# Patient Record
Sex: Female | Born: 1995
Health system: Southern US, Community
[De-identification: ages and names within clinical notes are randomized; demographics above are authoritative.]

## PROBLEM LIST (undated history)

## (undated) DIAGNOSIS — N921 Excessive and frequent menstruation with irregular cycle: Secondary | ICD-10-CM

## (undated) HISTORY — DX: Excessive and frequent menstruation with irregular cycle: N92.1

---
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
## 2017-03-18 ENCOUNTER — Other Ambulatory Visit: Payer: Self-pay | Admitting: Obstetrics and Gynecology

## 2017-03-18 DIAGNOSIS — Z3041 Encounter for surveillance of contraceptive pills: Secondary | ICD-10-CM

## 2017-04-15 ENCOUNTER — Telehealth: Payer: Self-pay | Admitting: Obstetrics and Gynecology

## 2017-04-15 NOTE — Telephone Encounter (Signed)
Spoke to pt and advised that 1 year of refills sent to [facility] [address] in April and most likely rx is on file. Pt to call [facility] and have rx transferred. Will call if not there.

## 2017-04-15 NOTE — Telephone Encounter (Signed)
Patient needs refill on bc, annual scheduled 10/17.  Preferred pharmacy [facility] on [address] in [place].

## 2017-05-15 ENCOUNTER — Ambulatory Visit: Payer: BLUE CROSS/BLUE SHIELD | Admitting: Obstetrics and Gynecology

## 2017-10-16 DIAGNOSIS — R300 Dysuria: Secondary | ICD-10-CM | POA: Diagnosis not present

## 2017-11-27 HISTORY — PX: WISDOM TOOTH EXTRACTION: SHX21

## 2017-12-17 ENCOUNTER — Other Ambulatory Visit: Payer: Self-pay | Admitting: Obstetrics and Gynecology

## 2017-12-17 DIAGNOSIS — Z3041 Encounter for surveillance of contraceptive pills: Secondary | ICD-10-CM

## 2017-12-20 ENCOUNTER — Other Ambulatory Visit: Payer: Self-pay | Admitting: Obstetrics and Gynecology

## 2017-12-20 DIAGNOSIS — Z3041 Encounter for surveillance of contraceptive pills: Secondary | ICD-10-CM

## 2017-12-20 NOTE — Telephone Encounter (Signed)
Pt needed 1 refill to get to annual appt with [facility]. Sent in one refill to pharm for pt. Annual 6/11

## 2017-12-27 ENCOUNTER — Telehealth: Payer: Self-pay | Admitting: Obstetrics and Gynecology

## 2017-12-27 NOTE — Telephone Encounter (Signed)
Patient is schedule for annual with [facility] and is requesting refill on birthcontrol please advise

## 2017-12-30 NOTE — Telephone Encounter (Signed)
Called pt to see which pharmacy to send prescription to because there is a receipt from the [facility] in [place] on 12/20/17. Left vm for pt to call back and give [place] some more information.

## 2018-01-07 ENCOUNTER — Encounter: Payer: Self-pay | Admitting: Obstetrics and Gynecology

## 2018-01-07 ENCOUNTER — Ambulatory Visit (INDEPENDENT_AMBULATORY_CARE_PROVIDER_SITE_OTHER): Payer: BLUE CROSS/BLUE SHIELD | Admitting: Obstetrics and Gynecology

## 2018-01-07 VITALS — BP 110/60 | Ht 71.0 in | Wt 133.0 lb

## 2018-01-07 DIAGNOSIS — Z113 Encounter for screening for infections with a predominantly sexual mode of transmission: Secondary | ICD-10-CM | POA: Diagnosis not present

## 2018-01-07 DIAGNOSIS — Z30016 Encounter for initial prescription of transdermal patch hormonal contraceptive device: Secondary | ICD-10-CM

## 2018-01-07 DIAGNOSIS — Z3041 Encounter for surveillance of contraceptive pills: Secondary | ICD-10-CM | POA: Diagnosis not present

## 2018-01-07 DIAGNOSIS — Z01419 Encounter for gynecological examination (general) (routine) without abnormal findings: Secondary | ICD-10-CM | POA: Diagnosis not present

## 2018-01-07 MED ORDER — NORELGESTROMIN-ETH ESTRADIOL 150-35 MCG/24HR TD PTWK: 1.0000 | MEDICATED_PATCH | TRANSDERMAL | 3 refills | Status: DC

## 2018-01-07 NOTE — Progress Notes (Signed)
Chief Complaint  Patient presents with  . Gynecologic Exam     HPI:      [name] is a 22 y.o. G0P0000 who LMP was Patient's last menstrual period was 12/24/2017., presents today for her annual examination. Her menses are regular every 28-30 days, lasting 5 days. Dysmenorrhea mild, occurring first 1-2 days of flow. She continues to have BTB with OCPs, without late/missed pills. Has missed pills but doesn't relate BTB afterwards. Has trouble taking pills daily. Would like non-daily BC. Stopped pills about 2 wks ago when Rx ran out.   Sex activity: single partner, contraception - condoms.  Hx of STDs: none  There is no FH of breast cancer. There is no FH of ovarian cancer. The patient does do self-breast exams.  Tobacco use: The patient denies current or previous tobacco use. Alcohol use: none Exercise: very active  She does get adequate calcium and Vitamin D in her diet.   Past Medical History:  Diagnosis Date  . Menometrorrhagia     History reviewed. No pertinent surgical history.  Family History  Problem Relation Age of Onset  . Diabetes Paternal Grandfather   . Diabetes Maternal Grandmother     Social History   Socioeconomic History  . Marital status: Single    Spouse name: Not on file  . Number of children: Not on file  . Years of education: Not on file  . Highest education level: Not on file  Occupational History  . Not on file  Social Needs  . Financial resource strain: Not on file  . Food insecurity:    Worry: Not on file    Inability: Not on file  . Transportation needs:    Medical: Not on file    Non-medical: Not on file  Tobacco Use  . Smoking status: Never Smoker  . Smokeless tobacco: Never Used  Substance and Sexual Activity  . Alcohol use: No  . Drug use: No  . Sexual activity: Yes  Lifestyle  . Physical activity:    Days per week: Not on file    Minutes per session: Not on file  . Stress: Not on file  Relationships  .  Social connections:    Talks on phone: Not on file    Gets together: Not on file    Attends religious service: Not on file    Active member of club or organization: Not on file    Attends meetings of clubs or organizations: Not on file    Relationship status: Not on file  . Intimate partner violence:    Fear of current or ex partner: Not on file    Emotionally abused: Not on file    Physically abused: Not on file    Forced sexual activity: Not on file  Other Topics Concern  . Not on file  Social History Narrative  . Not on file    Current Outpatient Medications on File Prior to Visit  Medication Sig Dispense Refill  . amoxicillin (AMOXIL) 875 MG tablet Take 875 mg by mouth 2 (two) times daily.  0   No current facility-administered medications on file prior to visit.     ROS:  Review of Systems  Constitutional: Negative for fatigue, fever and unexpected weight change.  Respiratory: Negative for cough, shortness of breath and wheezing.   Cardiovascular: Negative for chest pain, palpitations and leg swelling.  Gastrointestinal: Negative for blood in stool, constipation, diarrhea, nausea and vomiting.  Endocrine: Negative for cold intolerance,  heat intolerance and polyuria.  Genitourinary: Negative for dyspareunia, dysuria, flank pain, frequency, genital sores, hematuria, menstrual problem, pelvic pain, urgency, vaginal bleeding, vaginal discharge and vaginal pain.  Musculoskeletal: Negative for back pain, joint swelling and myalgias.  Skin: Negative for rash.  Neurological: Positive for dizziness and headaches. Negative for syncope, light-headedness and numbness.  Hematological: Negative for adenopathy.  Psychiatric/Behavioral: Negative for agitation, confusion, sleep disturbance and suicidal ideas. The patient is not nervous/anxious.      Objective: BP 110/60   Ht 5\' 11"  (1.803 m)   Wt 133 lb (60.3 kg)   LMP 12/24/2017   BMI 18.55 kg/m    Physical Exam    Constitutional: She is oriented to person, place, and time. She appears well-developed and well-nourished.  Genitourinary: Vagina normal and uterus normal. There is no rash or tenderness on the right labia. There is no rash or tenderness on the left labia. No erythema or tenderness in the vagina. No vaginal discharge found. Right adnexum does not display mass and does not display tenderness. Left adnexum does not display mass and does not display tenderness. Cervix does not exhibit motion tenderness or polyp. Uterus is not enlarged or tender.  Neck: Normal range of motion. No thyromegaly present.  Cardiovascular: Normal rate, regular rhythm and normal heart sounds.  No murmur heard. Pulmonary/Chest: Effort normal and breath sounds normal. Right breast exhibits no mass, no nipple discharge, no skin change and no tenderness. Left breast exhibits no mass, no nipple discharge, no skin change and no tenderness.  Abdominal: Soft. There is no tenderness. There is no guarding.  Musculoskeletal: Normal range of motion.  Neurological: She is alert and oriented to person, place, and time. No cranial nerve deficit.  Psychiatric: She has a normal mood and affect. Her behavior is normal.  Vitals reviewed.   Assessment/Plan: Encounter for annual routine gynecological examination  Screening for STD (sexually transmitted disease) - Plan: Chlamydia/Gonococcus/Trichomonas, NAA  Encounter for initial prescription of transdermal patch hormonal contraceptive device - BC options discussed. Pt wants to try xulane. Rx eRxd. Handout given. Start with next menses/condoms. F/u prn.  - Plan: norelgestromin-ethinyl estradiol Marilu Favre) 150-35 MCG/24HR transdermal patch  Meds ordered this encounter  Medications  . norelgestromin-ethinyl estradiol Marilu Favre) 150-35 MCG/24HR transdermal patch    Sig: Place 1 patch onto the skin once a week. Apply 1 patch weekly for 3 weeks, then 1 week without patch    Dispense:  9 patch     Refill:  3    Order Specific Question:   Supervising Provider    Answer:   Gae Dry [[id number]]             GYN counsel STD prevention, use and side effects of OCP's, adequate intake of calcium and vitamin D, diet and exercise     F/U  Return in about 1 year (around 01/08/2019).  Alicia B. Copland, PA-C 01/07/2018 2:45 PM

## 2018-01-07 NOTE — Patient Instructions (Signed)
I value your feedback and entrusting us with your care. If you get a [facility] patient survey, I would appreciate you taking the time to let us know about your experience today. Thank you! 

## 2018-01-09 LAB — CHLAMYDIA/GONOCOCCUS/TRICHOMONAS, NAA
CHLAMYDIA BY NAA: NEGATIVE
Gonococcus by NAA: NEGATIVE
TRICH VAG BY NAA: NEGATIVE

## 2018-02-20 DIAGNOSIS — Z113 Encounter for screening for infections with a predominantly sexual mode of transmission: Secondary | ICD-10-CM | POA: Diagnosis not present

## 2018-02-24 ENCOUNTER — Telehealth: Payer: Self-pay | Admitting: Obstetrics and Gynecology

## 2018-02-24 NOTE — Telephone Encounter (Signed)
Patient has a recurring UTI and would like a refill on the previous medicine that was prescribed for that.

## 2018-02-24 NOTE — Telephone Encounter (Signed)
Please advise. Thank you

## 2018-02-24 NOTE — Telephone Encounter (Signed)
We haven't treated pt for UTI. She can f/u with whomever treated her last.

## 2018-02-25 NOTE — Telephone Encounter (Signed)
Called pt to talk with her about original doctor that gave her rx and I have left a vm for her to call back

## 2018-06-09 ENCOUNTER — Telehealth: Payer: Self-pay

## 2018-06-09 NOTE — Telephone Encounter (Signed)
Pt called triage saying she wants to switch BC method, from patch to pills. Please advise.

## 2018-06-09 NOTE — Telephone Encounter (Signed)
She had BTB with previous OCPs. Does she want to try different ones to see if helps BTB or restart old ones?

## 2018-06-09 NOTE — Telephone Encounter (Signed)
Called pt, no answer, LVMTRC. 

## 2018-06-17 NOTE — Telephone Encounter (Signed)
Pt called back and said she wants to use a generic brand of what she has used before.

## 2018-06-18 ENCOUNTER — Other Ambulatory Visit: Payer: Self-pay | Admitting: Obstetrics and Gynecology

## 2018-06-18 DIAGNOSIS — Z3041 Encounter for surveillance of contraceptive pills: Secondary | ICD-10-CM

## 2018-06-18 MED ORDER — NORGESTIMATE-ETH ESTRADIOL 0.25-35 MG-MCG PO TABS: 1.0000 | ORAL_TABLET | Freq: Every day | ORAL | 1 refills | Status: DC

## 2018-06-18 NOTE — Telephone Encounter (Signed)
Rx OCPs eRxd. Start when time to start new pack of xulane. Condoms for 1 wk. F/u prn.

## 2018-06-18 NOTE — Telephone Encounter (Signed)
Pt aware. Also updated her preferred pharmacies.

## 2018-06-18 NOTE — Progress Notes (Signed)
Rx change back to OCPs from xulane. Annual due 6/20

## 2018-09-01 DIAGNOSIS — J029 Acute pharyngitis, unspecified: Secondary | ICD-10-CM | POA: Diagnosis not present

## 2018-09-01 DIAGNOSIS — Z113 Encounter for screening for infections with a predominantly sexual mode of transmission: Secondary | ICD-10-CM | POA: Diagnosis not present

## 2018-09-13 DIAGNOSIS — A7489 Other chlamydial diseases: Secondary | ICD-10-CM | POA: Diagnosis not present

## 2018-09-13 DIAGNOSIS — A5400 Gonococcal infection of lower genitourinary tract, unspecified: Secondary | ICD-10-CM | POA: Diagnosis not present

## 2018-12-24 ENCOUNTER — Telehealth: Payer: Self-pay

## 2018-12-24 NOTE — Telephone Encounter (Signed)
Pt called; has appt 6/22nd; wants to get IUD put in; not living in [place] now so it will be hard for her to make the trip here for IUD consultation.  What is best option for her?  [phone]  Called pt to adv [facility] not in office until next week.  Pt plans to keep appt on 6/22nd.  Adv not sure if consultation can be done over the phone or not - that's up to the provider.  Pt not sure which IUD she wants.  Pt can be reached at this same number next week.

## 2018-12-29 MED ORDER — MISOPROSTOL 100 MCG PO TABS: 100.0000 ug | ORAL_TABLET | Freq: Once | ORAL | 0 refills | Status: DC

## 2018-12-29 NOTE — Telephone Encounter (Signed)
Patient returning missed call from [facility]. Please advise

## 2018-12-29 NOTE — Addendum Note (Signed)
Addended by: Ardeth Perfect B on: 11/01/9199 01:39 PM   Modules accepted: Orders

## 2018-12-29 NOTE — Telephone Encounter (Signed)
Discussed IUDs with pt. She is interested in [place]. Has done OCPs and xulane in past. [place] annual sched 6/22 and will most likely be having period then. Rx cytotec/NSAIDs 1 hr before. Will call to resched annual/IUD insertion with period if not bleeding for already sched appt. Pt aware she can be worked into sched for 1 appt since coming from [place].

## 2018-12-29 NOTE — Telephone Encounter (Signed)
LMTRC

## 2019-01-19 ENCOUNTER — Encounter: Payer: BLUE CROSS/BLUE SHIELD | Admitting: Obstetrics and Gynecology

## 2019-01-19 NOTE — Patient Instructions (Signed)
I value your feedback and entrusting us with your care. If you get a [facility] patient survey, I would appreciate you taking the time to let us know about your experience today. Thank you! 

## 2019-01-19 NOTE — Progress Notes (Deleted)
No chief complaint on file.    HPI:      [name] is a 23 y.o. G0P0000 who LMP was No LMP recorded., presents today for her annual examination. Her menses are regular every 28-30 days, lasting 5 days. Dysmenorrhea mild, occurring first 1-2 days of flow. She continues to have BTB with OCPs, without late/missed pills. Has missed pills but doesn't relate BTB afterwards. Has trouble taking pills daily. Would like non-daily BC. Stopped pills about 2 wks ago when Rx ran out.   Sex activity: single partner, contraception - condoms.  Hx of STDs: none  There is no FH of breast cancer. There is no FH of ovarian cancer. The patient does do self-breast exams.  Tobacco use: The patient denies current or previous tobacco use. Alcohol use: none Exercise: very active  She does get adequate calcium and Vitamin D in her diet.   Past Medical History:  Diagnosis Date  . Menometrorrhagia     No past surgical history on file.  Family History  Problem Relation Age of Onset  . Diabetes Paternal Grandfather   . Diabetes Maternal Grandmother     Social History   Socioeconomic History  . Marital status: Single    Spouse name: Not on file  . Number of children: Not on file  . Years of education: Not on file  . Highest education level: Not on file  Occupational History  . Not on file  Social Needs  . Financial resource strain: Not on file  . Food insecurity    Worry: Not on file    Inability: Not on file  . Transportation needs    Medical: Not on file    Non-medical: Not on file  Tobacco Use  . Smoking status: Never Smoker  . Smokeless tobacco: Never Used  Substance and Sexual Activity  . Alcohol use: No  . Drug use: No  . Sexual activity: Yes  Lifestyle  . Physical activity    Days per week: Not on file    Minutes per session: Not on file  . Stress: Not on file  Relationships  . Social Herbalist on phone: Not on file    Gets together: Not on file     Attends religious service: Not on file    Active member of club or organization: Not on file    Attends meetings of clubs or organizations: Not on file    Relationship status: Not on file  . Intimate partner violence    Fear of current or ex partner: Not on file    Emotionally abused: Not on file    Physically abused: Not on file    Forced sexual activity: Not on file  Other Topics Concern  . Not on file  Social History Narrative  . Not on file    Current Outpatient Medications on File Prior to Visit  Medication Sig Dispense Refill  . amoxicillin (AMOXIL) 875 MG tablet Take 875 mg by mouth 2 (two) times daily.  0  . misoprostol (CYTOTEC) 100 MCG tablet Take 1 tablet (100 mcg total) by mouth once for 1 dose. 1 hour before appt 1 tablet 0  . norgestimate-ethinyl estradiol (ESTARYLLA) 0.25-35 MG-MCG tablet Take 1 tablet by mouth daily. 84 tablet 1   No current facility-administered medications on file prior to visit.     ROS:  Review of Systems  Constitutional: Negative for fatigue, fever and unexpected weight change.  Respiratory: Negative for cough,  shortness of breath and wheezing.   Cardiovascular: Negative for chest pain, palpitations and leg swelling.  Gastrointestinal: Negative for blood in stool, constipation, diarrhea, nausea and vomiting.  Endocrine: Negative for cold intolerance, heat intolerance and polyuria.  Genitourinary: Negative for dyspareunia, dysuria, flank pain, frequency, genital sores, hematuria, menstrual problem, pelvic pain, urgency, vaginal bleeding, vaginal discharge and vaginal pain.  Musculoskeletal: Negative for back pain, joint swelling and myalgias.  Skin: Negative for rash.  Neurological: Positive for dizziness and headaches. Negative for syncope, light-headedness and numbness.  Hematological: Negative for adenopathy.  Psychiatric/Behavioral: Negative for agitation, confusion, sleep disturbance and suicidal ideas. The patient is not  nervous/anxious.      Objective: There were no vitals taken for this visit.   Physical Exam Constitutional:      Appearance: She is well-developed.  Genitourinary:     Vagina and uterus normal.     No vaginal discharge, erythema or tenderness.     No cervical motion tenderness or polyp.     Uterus is not enlarged or tender.     No right or left adnexal mass present.     Right adnexa not tender.     Left adnexa not tender.  Neck:     Musculoskeletal: Normal range of motion.     Thyroid: No thyromegaly.  Cardiovascular:     Rate and Rhythm: Normal rate and regular rhythm.     Heart sounds: Normal heart sounds. No murmur.  Pulmonary:     Effort: Pulmonary effort is normal.     Breath sounds: Normal breath sounds.  Chest:     Breasts:        Right: No mass, nipple discharge, skin change or tenderness.        Left: No mass, nipple discharge, skin change or tenderness.  Abdominal:     Palpations: Abdomen is soft.     Tenderness: There is no abdominal tenderness. There is no guarding.  Musculoskeletal: Normal range of motion.  Neurological:     Mental Status: She is alert and oriented to person, place, and time.     Cranial Nerves: No cranial nerve deficit.  Psychiatric:        Behavior: Behavior normal.  Vitals signs reviewed.     Assessment/Plan: No diagnosis found.  No orders of the defined types were placed in this encounter.            GYN counsel STD prevention, use and side effects of OCP's, adequate intake of calcium and vitamin D, diet and exercise     F/U  No follow-ups on file.  Dmauri Rosenow B. Marvell Tamer, PA-C 01/19/2019 1:28 PM

## 2019-01-22 NOTE — Progress Notes (Signed)
This encounter was created in error - please disregard.

## 2019-03-21 DIAGNOSIS — R300 Dysuria: Secondary | ICD-10-CM | POA: Diagnosis not present

## 2019-04-27 DIAGNOSIS — B850 Pediculosis due to Pediculus humanus capitis: Secondary | ICD-10-CM | POA: Diagnosis not present

## 2019-06-03 NOTE — Progress Notes (Addendum)
Chief Complaint  Patient presents with  . Gynecologic Exam    flu shot     HPI:      [name] is a 23 y.o. G0P0000 who LMP was Patient's last menstrual period was 05/22/2019 (approximate)., presents today for her annual examination. Her menses are regular every 28-30 days, lasting 5 days. Dysmenorrhea mild, occurring first 1-2 days of flow. No BTB. Tried xulane last yr but had mood changes. Went back to OCPs and doing well. Rx ran out. Would like to restart for menometrorrhagia. Hx of anemia in past.  Sex activity: not currently - OCPs  Hx of STDs: none  There is no FH of breast cancer. There is no FH of ovarian cancer. The patient does do self-breast exams.  Tobacco use: The patient denies current or previous tobacco use. Alcohol use: social No drug use.  Exercise: mod active  She does get adequate calcium but not Vitamin D in her diet.  Has noted a wt loss recently with decreased appetite. Hx of anemia, taking iron supp, sometimes on empty stomach. Has lost 12# since we saw her last yr.  Pt with cervical LAN bilat, R>L. States they never went down after flu last yr. Not tender.   Past Medical History:  Diagnosis Date  . Menometrorrhagia     History reviewed. No pertinent surgical history.  Family History  Problem Relation Age of Onset  . Diabetes Paternal Grandfather   . Parkinson's disease Paternal Grandfather   . Diabetes Maternal Grandmother   . Heart attack Maternal Grandmother     Social History   Socioeconomic History  . Marital status: Single    Spouse name: Not on file  . Number of children: Not on file  . Years of education: Not on file  . Highest education level: Not on file  Occupational History  . Not on file  Social Needs  . Financial resource strain: Not on file  . Food insecurity    Worry: Not on file    Inability: Not on file  . Transportation needs    Medical: Not on file    Non-medical: Not on file  Tobacco Use  .  Smoking status: Never Smoker  . Smokeless tobacco: Never Used  Substance and Sexual Activity  . Alcohol use: No  . Drug use: No  . Sexual activity: Yes    Birth control/protection: Condom  Lifestyle  . Physical activity    Days per week: Not on file    Minutes per session: Not on file  . Stress: Not on file  Relationships  . Social Herbalist on phone: Not on file    Gets together: Not on file    Attends religious service: Not on file    Active member of club or organization: Not on file    Attends meetings of clubs or organizations: Not on file    Relationship status: Not on file  . Intimate partner violence    Fear of current or ex partner: Not on file    Emotionally abused: Not on file    Physically abused: Not on file    Forced sexual activity: Not on file  Other Topics Concern  . Not on file  Social History Narrative  . Not on file    No current outpatient medications on file prior to visit.   No current facility-administered medications on file prior to visit.     ROS:  Review of Systems  Constitutional: Positive for unexpected weight change. Negative for fatigue and fever.  Respiratory: Negative for cough, shortness of breath and wheezing.   Cardiovascular: Negative for chest pain, palpitations and leg swelling.  Gastrointestinal: Negative for blood in stool, constipation, diarrhea, nausea and vomiting.  Endocrine: Negative for cold intolerance, heat intolerance and polyuria.  Genitourinary: Positive for frequency. Negative for dyspareunia, dysuria, flank pain, genital sores, hematuria, menstrual problem, pelvic pain, urgency, vaginal bleeding, vaginal discharge and vaginal pain.  Musculoskeletal: Negative for back pain, joint swelling and myalgias.  Skin: Negative for rash.  Neurological: Negative for dizziness, syncope, light-headedness, numbness and headaches.  Hematological: Negative for adenopathy.  Psychiatric/Behavioral: Negative for agitation,  confusion, sleep disturbance and suicidal ideas. The patient is not nervous/anxious.      Objective: BP 90/66   Ht 5\' 11"  (1.803 m)   Wt 121 lb (54.9 kg)   LMP 05/22/2019 (Approximate)   BMI 16.88 kg/m    Physical Exam Constitutional:      Appearance: She is well-developed.  Genitourinary:     Vulva, vagina, uterus, right adnexa and left adnexa normal.     No vulval lesion or tenderness noted.     No vaginal discharge, erythema or tenderness.     No cervical motion tenderness or polyp.     Uterus is not enlarged or tender.     No right or left adnexal mass present.     Right adnexa not tender.     Left adnexa not tender.  Neck:     Musculoskeletal: Normal range of motion.     Thyroid: No thyromegaly.   Cardiovascular:     Rate and Rhythm: Normal rate and regular rhythm.     Heart sounds: Normal heart sounds. No murmur.  Pulmonary:     Effort: Pulmonary effort is normal.     Breath sounds: Normal breath sounds.  Chest:     Breasts:        Right: No mass, nipple discharge, skin change or tenderness.        Left: No mass, nipple discharge, skin change or tenderness.  Abdominal:     Palpations: Abdomen is soft.     Tenderness: There is no abdominal tenderness. There is no guarding.  Musculoskeletal: Normal range of motion.  Lymphadenopathy:     Cervical: Cervical adenopathy present.     Right cervical: Superficial cervical adenopathy present.     Left cervical: Superficial cervical adenopathy present.  Neurological:     General: No focal deficit present.     Mental Status: She is alert and oriented to person, place, and time.     Cranial Nerves: No cranial nerve deficit.  Skin:    General: Skin is warm and dry.  Psychiatric:        Mood and Affect: Mood normal.        Behavior: Behavior normal.        Thought Content: Thought content normal.        Judgment: Judgment normal.  Vitals signs reviewed.     Assessment/Plan: Encounter for annual routine  gynecological examination  Screening for STD--gon/chlam.   Encounter for surveillance of contraceptive pills - Plan: norgestimate-ethinyl estradiol (ESTARYLLA) 0.25-35 MG-MCG tablet; OCP RF. Start with next menses, condoms.   Lymphadenopathy of head and neck - Plan: Comprehensive metabolic panel, CBC with Differential/Platelet; Check labs. If neg, will refer to gen surg for further eval.  Weight loss - Plan: TSH + free T4; check labs. Will f/u with results.  Blood tests for routine general physical examination - Plan: Comprehensive metabolic panel, CBC with Differential/Platelet  Needs flu shot - Plan: Flu Vaccine QUAD 36+ mos IM (Fluarix, Quad PF)  Meds ordered this encounter  Medications  . norgestimate-ethinyl estradiol (ESTARYLLA) 0.25-35 MG-MCG tablet    Sig: Take 1 tablet by mouth daily.    Dispense:  84 tablet    Refill:  3    Order Specific Question:   Supervising Provider    Answer:   Gae Dry [id number]             GYN counsel STD prevention, adequate intake of calcium and vitamin D, diet and exercise     F/U  Return in about 1 year (around 06/03/2020).  Raven Vokes B. Lyah Millirons, PA-C 06/04/2019 10:25 AM

## 2019-06-04 ENCOUNTER — Other Ambulatory Visit (HOSPITAL_COMMUNITY)
Admission: RE | Admit: 2019-06-04 | Discharge: 2019-06-04 | Disposition: A | Discharge status: Home / Self Care | Payer: BC Managed Care – PPO | Source: Ambulatory Visit | Attending: Obstetrics and Gynecology | Admitting: Obstetrics and Gynecology

## 2019-06-04 ENCOUNTER — Ambulatory Visit (INDEPENDENT_AMBULATORY_CARE_PROVIDER_SITE_OTHER): Payer: BC Managed Care – PPO | Admitting: Obstetrics and Gynecology

## 2019-06-04 ENCOUNTER — Other Ambulatory Visit: Payer: Self-pay

## 2019-06-04 ENCOUNTER — Encounter: Payer: Self-pay | Admitting: Obstetrics and Gynecology

## 2019-06-04 VITALS — BP 90/66 | Ht 71.0 in | Wt 121.0 lb

## 2019-06-04 DIAGNOSIS — Z01419 Encounter for gynecological examination (general) (routine) without abnormal findings: Secondary | ICD-10-CM | POA: Diagnosis not present

## 2019-06-04 DIAGNOSIS — Z23 Encounter for immunization: Secondary | ICD-10-CM | POA: Diagnosis not present

## 2019-06-04 DIAGNOSIS — R634 Abnormal weight loss: Secondary | ICD-10-CM

## 2019-06-04 DIAGNOSIS — Z113 Encounter for screening for infections with a predominantly sexual mode of transmission: Secondary | ICD-10-CM | POA: Diagnosis not present

## 2019-06-04 DIAGNOSIS — Z0000 Encounter for general adult medical examination without abnormal findings: Secondary | ICD-10-CM

## 2019-06-04 DIAGNOSIS — R591 Generalized enlarged lymph nodes: Secondary | ICD-10-CM

## 2019-06-04 DIAGNOSIS — Z3041 Encounter for surveillance of contraceptive pills: Secondary | ICD-10-CM

## 2019-06-04 MED ORDER — NORGESTIMATE-ETH ESTRADIOL 0.25-35 MG-MCG PO TABS: 1.0000 | ORAL_TABLET | Freq: Every day | ORAL | 3 refills | Status: DC

## 2019-06-04 NOTE — Patient Instructions (Addendum)
I value your feedback and entrusting us with your care. If you get a [facility] patient survey, I would appreciate you taking the time to let us know about your experience today. Thank you! 

## 2019-06-04 NOTE — Addendum Note (Signed)
Addended by: Ardeth Perfect B on: [id number] 10:34 AM   Modules accepted: Orders

## 2019-06-05 LAB — COMPREHENSIVE METABOLIC PANEL
ALT: 10 IU/L (ref 0–32)
AST: 16 IU/L (ref 0–40)
Albumin/Globulin Ratio: 1.8 (ref 1.2–2.2)
Albumin: 4.5 g/dL (ref 3.9–5.0)
Alkaline Phosphatase: 71 IU/L (ref 39–117)
BUN/Creatinine Ratio: 11 (ref 9–23)
BUN: 12 mg/dL (ref 6–20)
Bilirubin Total: 0.8 mg/dL (ref 0.0–1.2)
CO2: 22 mmol/L (ref 20–29)
Calcium: 9.9 mg/dL (ref 8.7–10.2)
Chloride: 98 mmol/L (ref 96–106)
Creatinine, Ser: 1.09 mg/dL — ABNORMAL HIGH (ref 0.57–1.00)
GFR calc Af Amer: 84 mL/min/{1.73_m2} (ref >59)
GFR calc non Af Amer: 73 mL/min/{1.73_m2} (ref >59)
Globulin, Total: 2.5 g/dL (ref 1.5–4.5)
Glucose: 92 mg/dL (ref 65–99)
Potassium: 4.2 mmol/L (ref 3.5–5.2)
Sodium: 139 mmol/L (ref 134–144)
Total Protein: 7 g/dL (ref 6.0–8.5)

## 2019-06-05 LAB — CBC WITH DIFFERENTIAL/PLATELET
Basophils Absolute: 0.1 10*3/uL (ref 0.0–0.2)
Basos: 1 %
EOS (ABSOLUTE): 0.4 10*3/uL (ref 0.0–0.4)
Eos: 4 %
Hematocrit: 39.4 % (ref 34.0–46.6)
Hemoglobin: 13.2 g/dL (ref 11.1–15.9)
Immature Grans (Abs): 0 10*3/uL (ref 0.0–0.1)
Immature Granulocytes: 0 %
Lymphocytes Absolute: 1.5 10*3/uL (ref 0.7–3.1)
Lymphs: 15 %
MCH: 30.4 pg (ref 26.6–33.0)
MCHC: 33.5 g/dL (ref 31.5–35.7)
MCV: 91 fL (ref 79–97)
Monocytes Absolute: 0.7 10*3/uL (ref 0.1–0.9)
Monocytes: 8 %
Neutrophils Absolute: 7 10*3/uL (ref 1.4–7.0)
Neutrophils: 72 %
Platelets: 274 10*3/uL (ref 150–450)
RBC: 4.34 x10E6/uL (ref 3.77–5.28)
RDW: 12.1 % (ref 11.7–15.4)
WBC: 9.6 10*3/uL (ref 3.4–10.8)

## 2019-06-05 LAB — TSH+FREE T4
Free T4: 1.28 ng/dL (ref 0.82–1.77)
TSH: 0.88 u[IU]/mL (ref 0.450–4.500)

## 2019-06-05 LAB — CERVICOVAGINAL ANCILLARY ONLY
Chlamydia: NEGATIVE
Comment: NEGATIVE
Comment: NORMAL
Neisseria Gonorrhea: NEGATIVE

## 2019-06-08 ENCOUNTER — Telehealth: Payer: Self-pay | Admitting: Obstetrics and Gynecology

## 2019-06-08 DIAGNOSIS — R591 Generalized enlarged lymph nodes: Secondary | ICD-10-CM

## 2019-06-08 NOTE — Telephone Encounter (Signed)
[facility]. Neg labs, will refer to ENT due to [facility] for past yr with wt loss.

## 2019-06-08 NOTE — Telephone Encounter (Signed)
Pt aware of labs. Refer to ENT for LAN.

## 2019-06-18 ENCOUNTER — Telehealth: Payer: Self-pay

## 2019-06-18 ENCOUNTER — Other Ambulatory Visit: Payer: Self-pay | Admitting: Unknown Physician Specialty

## 2019-06-18 DIAGNOSIS — R599 Enlarged lymph nodes, unspecified: Secondary | ICD-10-CM | POA: Diagnosis not present

## 2019-06-18 DIAGNOSIS — R221 Localized swelling, mass and lump, neck: Secondary | ICD-10-CM

## 2019-06-18 NOTE — Telephone Encounter (Signed)
Pt calling.  Has questions about blood work.  [phone]

## 2019-06-18 NOTE — Telephone Encounter (Signed)
Pt wanted to know if labwork done recently would show if she was pregnant. Advised not in these.

## 2019-06-29 ENCOUNTER — Ambulatory Visit
Admission: RE | Admit: 2019-06-29 | Discharge: 2019-06-29 | Disposition: A | Discharge status: Home / Self Care | Payer: BC Managed Care – PPO | Source: Ambulatory Visit | Attending: Unknown Physician Specialty | Admitting: Unknown Physician Specialty

## 2019-06-29 ENCOUNTER — Other Ambulatory Visit: Payer: Self-pay

## 2019-06-29 DIAGNOSIS — R221 Localized swelling, mass and lump, neck: Secondary | ICD-10-CM | POA: Diagnosis not present

## 2019-07-03 ENCOUNTER — Other Ambulatory Visit: Payer: Self-pay | Admitting: Unknown Physician Specialty

## 2019-07-03 DIAGNOSIS — R221 Localized swelling, mass and lump, neck: Secondary | ICD-10-CM

## 2019-07-07 ENCOUNTER — Other Ambulatory Visit: Payer: Self-pay | Admitting: Student

## 2019-07-08 ENCOUNTER — Ambulatory Visit
Admission: RE | Admit: 2019-07-08 | Discharge: 2019-07-08 | Disposition: A | Discharge status: Home / Self Care | Payer: BC Managed Care – PPO | Source: Ambulatory Visit | Attending: Unknown Physician Specialty | Admitting: Unknown Physician Specialty

## 2019-07-08 ENCOUNTER — Other Ambulatory Visit: Payer: Self-pay

## 2019-07-08 DIAGNOSIS — R590 Localized enlarged lymph nodes: Secondary | ICD-10-CM | POA: Diagnosis not present

## 2019-07-08 DIAGNOSIS — R221 Localized swelling, mass and lump, neck: Secondary | ICD-10-CM | POA: Insufficient documentation

## 2019-07-08 LAB — CBC
HCT: 36.5 % (ref 36.0–46.0)
Hemoglobin: 12.6 g/dL (ref 12.0–15.0)
MCH: 29.9 pg (ref 26.0–34.0)
MCHC: 34.5 g/dL (ref 30.0–36.0)
MCV: 86.5 fL (ref 80.0–100.0)
Platelets: 272 10*3/uL (ref 150–400)
RBC: 4.22 MIL/uL (ref 3.87–5.11)
RDW: 12.7 % (ref 11.5–15.5)
WBC: 5.9 10*3/uL (ref 4.0–10.5)
nRBC: 0 % (ref 0.0–0.2)

## 2019-07-08 LAB — PROTIME-INR
INR: 1 (ref 0.8–1.2)
Prothrombin Time: 13.3 s (ref 11.4–15.2)

## 2019-07-08 LAB — PREGNANCY, URINE: Preg Test, Ur: NEGATIVE

## 2019-07-08 MED ORDER — SODIUM CHLORIDE 0.9 % IV SOLN: INTRAVENOUS | Status: DC

## 2019-07-08 NOTE — Discharge Instructions (Signed)
Needle Biopsy, Care After °These instructions tell you how to care for yourself after your procedure. Your doctor may also give you more specific instructions. Call your doctor if you have any problems or questions. °What can I expect after the procedure? °After the procedure, it is common to have: °· Soreness. °· Bruising. °· Mild pain. °Follow these instructions at home: ° °· Return to your normal activities as told by your doctor. Ask your doctor what activities are safe for you. °· Take over-the-counter and prescription medicines only as told by your doctor. °· Wash your hands with soap and water before you change your bandage (dressing). If you cannot use soap and water, use hand sanitizer. °· Follow instructions from your doctor about: °? How to take care of your puncture site. °? When and how to change your bandage. °? When to remove your bandage. °· Check your puncture site every day for signs of infection. Watch for: °? Redness, swelling, or pain. °? Fluid or blood.  °? Pus or a bad smell. °? Warmth. °· Do not take baths, swim, or use a hot tub until your doctor approves. Ask your doctor if you may take showers. You may only be allowed to take sponge baths. °· Keep all follow-up visits as told by your doctor. This is important. °Contact a doctor if you have: °· A fever. °· Redness, swelling, or pain at the puncture site, and it lasts longer than a few days. °· Fluid, blood, or pus coming from the puncture site. °· Warmth coming from the puncture site. °Get help right away if: °· You have a lot of bleeding from the puncture site. °Summary °· After the procedure, it is common to have soreness, bruising, or mild pain at the puncture site. °· Check your puncture site every day for signs of infection, such as redness, swelling, or pain. °· Get help right away if you have severe bleeding from your puncture site. °This information is not intended to replace advice given to you by your health care provider. Make  sure you discuss any questions you have with your health care provider. °Document Released: 06/28/2008 Document Revised: 07/29/2017 Document Reviewed: 07/29/2017 °Elsevier Patient Education © 2020 [employer] ° °

## 2019-07-08 NOTE — Procedures (Signed)
Interventional Radiology Procedure Note  Procedure: US guided right cervical lymph node biopsy.  Mx 18g bx acquired.  Samples submitted fresh for pathology and for culture   Complications: None  Recommendations:  - Ok to shower tomorrow - Do not submerge for 7 days - Routine care   Signed,  Dulcy Fanny. Earleen Newport, DO

## 2019-07-09 LAB — ACID FAST SMEAR (AFB, MYCOBACTERIA): Acid Fast Smear: NEGATIVE

## 2019-07-09 LAB — SURGICAL PATHOLOGY

## 2019-07-17 ENCOUNTER — Other Ambulatory Visit: Payer: Self-pay | Admitting: Unknown Physician Specialty

## 2019-07-17 DIAGNOSIS — L040 Acute lymphadenitis of face, head and neck: Secondary | ICD-10-CM

## 2019-07-28 ENCOUNTER — Other Ambulatory Visit: Payer: Self-pay | Admitting: Student

## 2019-07-29 ENCOUNTER — Other Ambulatory Visit: Payer: Self-pay | Admitting: Unknown Physician Specialty

## 2019-07-29 ENCOUNTER — Ambulatory Visit
Admission: RE | Admit: 2019-07-29 | Discharge: 2019-07-29 | Disposition: A | Discharge status: Home / Self Care | Payer: BC Managed Care – PPO | Source: Ambulatory Visit | Attending: Unknown Physician Specialty | Admitting: Unknown Physician Specialty

## 2019-07-29 DIAGNOSIS — R590 Localized enlarged lymph nodes: Secondary | ICD-10-CM | POA: Diagnosis not present

## 2019-07-29 DIAGNOSIS — L040 Acute lymphadenitis of face, head and neck: Secondary | ICD-10-CM

## 2019-07-29 MED ORDER — SODIUM CHLORIDE 0.9 % IV SOLN: INTRAVENOUS | Status: DC

## 2019-07-29 NOTE — Discharge Instructions (Signed)
Needle Biopsy, Care After °These instructions tell you how to care for yourself after your procedure. Your doctor may also give you more specific instructions. Call your doctor if you have any problems or questions. °What can I expect after the procedure? °After the procedure, it is common to have: °· Soreness. °· Bruising. °· Mild pain. °Follow these instructions at home: ° °· Return to your normal activities as told by your doctor. Ask your doctor what activities are safe for you. °· Take over-the-counter and prescription medicines only as told by your doctor. °· Wash your hands with soap and water before you change your bandage (dressing). If you cannot use soap and water, use hand sanitizer. °· Follow instructions from your doctor about: °? How to take care of your puncture site. °? When and how to change your bandage. °? When to remove your bandage. °· Check your puncture site every day for signs of infection. Watch for: °? Redness, swelling, or pain. °? Fluid or blood.  °? Pus or a bad smell. °? Warmth. °· Do not take baths, swim, or use a hot tub until your doctor approves. Ask your doctor if you may take showers. You may only be allowed to take sponge baths. °· Keep all follow-up visits as told by your doctor. This is important. °Contact a doctor if you have: °· A fever. °· Redness, swelling, or pain at the puncture site, and it lasts longer than a few days. °· Fluid, blood, or pus coming from the puncture site. °· Warmth coming from the puncture site. °Get help right away if: °· You have a lot of bleeding from the puncture site. °Summary °· After the procedure, it is common to have soreness, bruising, or mild pain at the puncture site. °· Check your puncture site every day for signs of infection, such as redness, swelling, or pain. °· Get help right away if you have severe bleeding from your puncture site. °This information is not intended to replace advice given to you by your health care provider. Make  sure you discuss any questions you have with your health care provider. °Document Released: 06/28/2008 Document Revised: 07/29/2017 Document Reviewed: 07/29/2017 °Elsevier Patient Education © 2020 [employer] ° °

## 2019-07-29 NOTE — Procedures (Signed)
Pre Procedure Dx: Right cervical lymphadenopathy Post Procedural Dx: Same  Technically successful US guided FNA and core biopsy of indeterminate high right cervical lymph node.  EBL: None No immediate complications.   Ronny Bacon, MD Pager #: [phone]

## 2019-08-03 ENCOUNTER — Encounter: Payer: Self-pay | Admitting: Unknown Physician Specialty

## 2019-08-03 LAB — CYTOLOGY - NON PAP

## 2019-08-03 LAB — SURGICAL PATHOLOGY

## 2019-08-21 LAB — ACID FAST CULTURE WITH REFLEXED SENSITIVITIES (MYCOBACTERIA): Acid Fast Culture: NEGATIVE

## 2019-11-13 DIAGNOSIS — D2271 Melanocytic nevi of right lower limb, including hip: Secondary | ICD-10-CM | POA: Diagnosis not present

## 2019-11-13 DIAGNOSIS — D2261 Melanocytic nevi of right upper limb, including shoulder: Secondary | ICD-10-CM | POA: Diagnosis not present

## 2019-11-13 DIAGNOSIS — D485 Neoplasm of uncertain behavior of skin: Secondary | ICD-10-CM | POA: Diagnosis not present

## 2019-11-13 DIAGNOSIS — D2262 Melanocytic nevi of left upper limb, including shoulder: Secondary | ICD-10-CM | POA: Diagnosis not present

## 2019-11-13 DIAGNOSIS — D225 Melanocytic nevi of trunk: Secondary | ICD-10-CM | POA: Diagnosis not present

## 2019-11-13 DIAGNOSIS — R208 Other disturbances of skin sensation: Secondary | ICD-10-CM | POA: Diagnosis not present

## 2020-04-17 DIAGNOSIS — Z20822 Contact with and (suspected) exposure to covid-19: Secondary | ICD-10-CM | POA: Diagnosis not present

## 2020-05-16 DIAGNOSIS — Z03818 Encounter for observation for suspected exposure to other biological agents ruled out: Secondary | ICD-10-CM | POA: Diagnosis not present

## 2020-05-16 DIAGNOSIS — Z20822 Contact with and (suspected) exposure to covid-19: Secondary | ICD-10-CM | POA: Diagnosis not present

## 2020-06-09 DIAGNOSIS — Z111 Encounter for screening for respiratory tuberculosis: Secondary | ICD-10-CM | POA: Diagnosis not present

## 2020-06-09 DIAGNOSIS — Z23 Encounter for immunization: Secondary | ICD-10-CM | POA: Diagnosis not present

## 2020-06-11 DIAGNOSIS — Z111 Encounter for screening for respiratory tuberculosis: Secondary | ICD-10-CM | POA: Diagnosis not present

## 2020-07-06 ENCOUNTER — Ambulatory Visit (INDEPENDENT_AMBULATORY_CARE_PROVIDER_SITE_OTHER): Payer: BC Managed Care – PPO | Admitting: Obstetrics and Gynecology

## 2020-07-06 ENCOUNTER — Other Ambulatory Visit (HOSPITAL_COMMUNITY)
Admission: RE | Admit: 2020-07-06 | Discharge: 2020-07-06 | Disposition: A | Discharge status: Home / Self Care | Payer: BC Managed Care – PPO | Source: Ambulatory Visit | Attending: Obstetrics and Gynecology | Admitting: Obstetrics and Gynecology

## 2020-07-06 ENCOUNTER — Other Ambulatory Visit: Payer: Self-pay

## 2020-07-06 ENCOUNTER — Encounter: Payer: Self-pay | Admitting: Obstetrics and Gynecology

## 2020-07-06 VITALS — BP 90/60 | Ht 71.0 in | Wt 131.0 lb

## 2020-07-06 DIAGNOSIS — Z113 Encounter for screening for infections with a predominantly sexual mode of transmission: Secondary | ICD-10-CM

## 2020-07-06 DIAGNOSIS — Z3041 Encounter for surveillance of contraceptive pills: Secondary | ICD-10-CM

## 2020-07-06 DIAGNOSIS — Z01419 Encounter for gynecological examination (general) (routine) without abnormal findings: Secondary | ICD-10-CM

## 2020-07-06 DIAGNOSIS — Z124 Encounter for screening for malignant neoplasm of cervix: Secondary | ICD-10-CM

## 2020-07-06 MED ORDER — LEVONORGESTREL-ETHINYL ESTRAD 0.1-20 MG-MCG PO TABS: 1.0000 | ORAL_TABLET | Freq: Every day | ORAL | 3 refills | Status: DC

## 2020-07-06 NOTE — Patient Instructions (Signed)
I value your feedback and entrusting us with your care. If you get a [facility] patient survey, I would appreciate you taking the time to let us know about your experience today. Thank you!  As of July 09, 2019, your lab results will be released to your MyChart immediately, before I even have a chance to see them. Please give me time to review them and contact you if there are any abnormalities. Thank you for your patience.  

## 2020-07-06 NOTE — Progress Notes (Signed)
Chief Complaint  Patient presents with  . Gynecologic Exam     HPI:      [name] is a 24 y.o. G0P0000 who LMP was Patient's last menstrual period was 06/26/2020 (exact date)., presents today for her annual examination. Her menses are regular every 28-30 days, lasting 5-6 days. Dysmenorrhea mild, occurring first 1-2 days of flow. No BTB usually but is having some this cycle. Went off OCPs about 6 months ago due to AM n/v (even though had been on them awhile). Stopped pills and sx resolved. Would like to restart pills. Tried xulane last yr but had mood changes. Hx of menorrhagia and anemia in past.  Sex activity: currently sex active, contraception - none now but was doing OCPs. Wants to restart them, will do condoms in meantime. Last pap: never due to age; due today. Hx of STDs: none  There is no FH of breast cancer. There is no FH of ovarian cancer. The patient does do self-breast exams.  Tobacco use: The patient denies current or previous tobacco use. Alcohol use: social No drug use.  Exercise: mod active  She does get adequate calcium and Vitamin D in her diet.   Past Medical History:  Diagnosis Date  . Menometrorrhagia     Past Surgical History:  Procedure Laterality Date  . WISDOM TOOTH EXTRACTION  11/2017    Family History  Problem Relation Age of Onset  . Diabetes Paternal Grandfather   . Parkinson's disease Paternal Grandfather   . Diabetes Maternal Grandmother   . Heart attack Maternal Grandmother     Social History   Socioeconomic History  . Marital status: Single    Spouse name: Not on file  . Number of children: Not on file  . Years of education: Not on file  . Highest education level: Not on file  Occupational History  . Not on file  Tobacco Use  . Smoking status: Never Smoker  . Smokeless tobacco: Never Used  Vaping Use  . Vaping Use: Never used  Substance and Sexual Activity  . Alcohol use: Yes    Comment: occasionally once  a week  . Drug use: No  . Sexual activity: Yes    Birth control/protection: None  Other Topics Concern  . Not on file  Social History Narrative   Lives alone in apartment   Social Determinants of Health   Financial Resource Strain:   . Difficulty of Paying Living Expenses: Not on file  Food Insecurity:   . Worried About Charity fundraiser in the Last Year: Not on file  . Ran Out of Food in the Last Year: Not on file  Transportation Needs:   . Lack of Transportation (Medical): Not on file  . Lack of Transportation (Non-Medical): Not on file  Physical Activity:   . Days of Exercise per Week: Not on file  . Minutes of Exercise per Session: Not on file  Stress:   . Feeling of Stress : Not on file  Social Connections:   . Frequency of Communication with Friends and Family: Not on file  . Frequency of Social Gatherings with Friends and Family: Not on file  . Attends Religious Services: Not on file  . Active Member of Clubs or Organizations: Not on file  . Attends Archivist Meetings: Not on file  . Marital Status: Not on file  Intimate Partner Violence:   . Fear of Current or Ex-Partner: Not on file  . Emotionally Abused:  Not on file  . Physically Abused: Not on file  . Sexually Abused: Not on file    No current outpatient medications on file prior to visit.   No current facility-administered medications on file prior to visit.    ROS:  Review of Systems  Constitutional: Negative for fatigue, fever and unexpected weight change.  Respiratory: Negative for cough, shortness of breath and wheezing.   Cardiovascular: Negative for chest pain, palpitations and leg swelling.  Gastrointestinal: Negative for blood in stool, constipation, diarrhea, nausea and vomiting.  Endocrine: Negative for cold intolerance, heat intolerance and polyuria.  Genitourinary: Positive for vaginal bleeding. Negative for dyspareunia, dysuria, flank pain, frequency, genital sores, hematuria,  menstrual problem, pelvic pain, urgency, vaginal discharge and vaginal pain.  Musculoskeletal: Negative for back pain, joint swelling and myalgias.  Skin: Negative for rash.  Neurological: Negative for dizziness, syncope, light-headedness, numbness and headaches.  Hematological: Negative for adenopathy.  Psychiatric/Behavioral: Negative for agitation, confusion, sleep disturbance and suicidal ideas. The patient is not nervous/anxious.      Objective: BP 90/60   Ht 5\' 11"  (1.803 m)   Wt 131 lb (59.4 kg)   LMP 06/26/2020 (Exact Date)   BMI 18.27 kg/m    Physical Exam Constitutional:      Appearance: She is well-developed.  Genitourinary:     Vulva, cervix, uterus, right adnexa and left adnexa normal.     No vulval lesion or tenderness noted.     Vaginal bleeding present.     No vaginal discharge, erythema or tenderness.     No cervical polyp.     Uterus is not enlarged or tender.     No right or left adnexal mass present.     Right adnexa not tender.     Left adnexa not tender.  Neck:     Thyroid: No thyromegaly.  Cardiovascular:     Rate and Rhythm: Normal rate and regular rhythm.     Heart sounds: Normal heart sounds. No murmur heard.   Pulmonary:     Effort: Pulmonary effort is normal.     Breath sounds: Normal breath sounds.  Chest:     Breasts:        Right: No mass, nipple discharge, skin change or tenderness.        Left: No mass, nipple discharge, skin change or tenderness.  Abdominal:     Palpations: Abdomen is soft.     Tenderness: There is no abdominal tenderness. There is no guarding.  Musculoskeletal:        General: Normal range of motion.     Cervical back: Normal range of motion.  Neurological:     General: No focal deficit present.     Mental Status: She is alert and oriented to person, place, and time.     Cranial Nerves: No cranial nerve deficit.  Skin:    General: Skin is warm and dry.  Psychiatric:        Mood and Affect: Mood normal.         Behavior: Behavior normal.        Thought Content: Thought content normal.        Judgment: Judgment normal.  Vitals reviewed.     Assessment/Plan: Encounter for annual routine gynecological examination  Cervical cancer screening - Plan: Cytology - PAP  Screening for STD (sexually transmitted disease) - Plan: Cytology - PAP  Encounter for surveillance of contraceptive pills - Plan: levonorgestrel-ethinyl estradiol (AVIANE) 0.1-20 MG-MCG tablet; Try lower dose estrogen  OCP due to n/v although prob not related. Rx eRxd. OCP start with next menses, condoms in meantime and for 1 mo. F/u prn n/v.    Meds ordered this encounter  Medications  . levonorgestrel-ethinyl estradiol (AVIANE) 0.1-20 MG-MCG tablet    Sig: Take 1 tablet by mouth daily.    Dispense:  84 tablet    Refill:  3    Order Specific Question:   Supervising Provider    Answer:   Gae Dry [[id number]]             GYN counsel STD prevention, adequate intake of calcium and vitamin D, diet and exercise     F/U  Return in about 1 year (around 07/06/2021).  Billiejean Schimek B. Dalila Arca, PA-C 07/06/2020 3:27 PM

## 2020-07-09 DIAGNOSIS — N923 Ovulation bleeding: Secondary | ICD-10-CM | POA: Diagnosis not present

## 2020-07-09 DIAGNOSIS — N939 Abnormal uterine and vaginal bleeding, unspecified: Secondary | ICD-10-CM | POA: Diagnosis not present

## 2020-07-09 DIAGNOSIS — Z3202 Encounter for pregnancy test, result negative: Secondary | ICD-10-CM | POA: Diagnosis not present

## 2020-07-11 LAB — CYTOLOGY - PAP
Chlamydia: NEGATIVE
Comment: NEGATIVE
Comment: NORMAL
Diagnosis: NEGATIVE
Neisseria Gonorrhea: NEGATIVE

## 2020-09-21 ENCOUNTER — Other Ambulatory Visit: Payer: Self-pay

## 2020-09-21 ENCOUNTER — Ambulatory Visit: Payer: BC Managed Care – PPO | Admitting: Dermatology

## 2020-09-21 DIAGNOSIS — L700 Acne vulgaris: Secondary | ICD-10-CM | POA: Diagnosis not present

## 2020-09-21 DIAGNOSIS — D225 Melanocytic nevi of trunk: Secondary | ICD-10-CM | POA: Diagnosis not present

## 2020-09-21 DIAGNOSIS — D229 Melanocytic nevi, unspecified: Secondary | ICD-10-CM

## 2020-09-21 MED ORDER — ADAPALENE 0.3 % EX GEL: CUTANEOUS | 2 refills | Status: DC

## 2020-09-21 MED ORDER — DOXYCYCLINE HYCLATE 100 MG PO TABS: 100.0000 mg | ORAL_TABLET | Freq: Every day | ORAL | 3 refills | Status: AC

## 2020-09-21 NOTE — Progress Notes (Signed)
   New Patient Visit  Subjective  Raven Reed is a 25 y.o. female who presents for the following: Acne (Pt c/o acne flare on her face, no treatment ) and Skin Problem (Check a mole on the L thigh, pt had a biopsy taken from this area several months ago by a different dermatologist, pt report mole was not completely removed, she would like it completely removed )  The following portions of the chart were reviewed this encounter and updated as appropriate:   Tobacco  Allergies  Meds  Problems  Med Hx  Surg Hx  Fam Hx     Review of Systems:  No other skin or systemic complaints except as noted in HPI or Assessment and Plan.  Objective  Well appearing patient in no apparent distress; mood and affect are within normal limits.  A focused examination was performed including face, L thigh. Relevant physical exam findings are noted in the Assessment and Plan.  Objective  face: ~10 Papules and chin and perioral area, comedones scattered   Objective  Left pubic area: Irregular brown macule    Assessment & Plan  Acne vulgaris -chronic and persistent.  Not to goal. face Start doxycycline and adapalene May add Winlevi in the future.  Start Doxycycline 100 mg take 1 tablet daily at dinner with food  Doxycycline should be taken with food to prevent nausea. Do not lay down for 30 minutes after taking. Be cautious with sun exposure and use good sun protection while on this medication. Pregnant women should not take this medication.    Start Adapalene 0.3 gel apply to face qhs  Topical retinoid medications like tretinoin/Retin-A, adapalene/Differin, tazarotene/Fabior, and Epiduo/Epiduo Forte can cause dryness and irritation when first started. Only apply a pea-sized amount to the entire affected area. Avoid applying it around the eyes, edges of mouth and creases at the nose. If you experience irritation, use a good moisturizer first and/or apply the medicine less often. If you are doing  well with the medicine, you can increase how often you use it until you are applying every night. Be careful with sun protection while using this medication as it can make you sensitive to the sun. This medicine should not be used by pregnant women.    May add Winilevi in the future  Ordered Medications: doxycycline (VIBRA-TABS) 100 MG tablet Adapalene (DIFFERIN) 0.3 % gel  Nevus -left pubic area removed by another dermatologist in the past but has now recurred.  Patient would like this removed. Left pubic area We will request a copy of biopsy taken from [facility] dermatology before any treatment  Pt will sign a release of records today, plan on removal at next office visit   Return in about 2 months (around 11/19/2020) for acne, mole .  IMarye Round, CMA, am acting as scribe for Sarina Ser, MD .  Documentation: I have reviewed the above documentation for accuracy and completeness, and I agree with the above.  Sarina Ser, MD

## 2020-09-21 NOTE — Patient Instructions (Signed)
Doxycycline should be taken with food to prevent nausea. Do not lay down for 30 minutes after taking. Be cautious with sun exposure and use good sun protection while on this medication. Pregnant women should not take this medication.   Topical retinoid medications like tretinoin/Retin-A, adapalene/Differin, tazarotene/Fabior, and Epiduo/Epiduo Forte can cause dryness and irritation when first started. Only apply a pea-sized amount to the entire affected area. Avoid applying it around the eyes, edges of mouth and creases at the nose. If you experience irritation, use a good moisturizer first and/or apply the medicine less often. If you are doing well with the medicine, you can increase how often you use it until you are applying every night. Be careful with sun protection while using this medication as it can make you sensitive to the sun. This medicine should not be used by pregnant women.

## 2020-09-24 ENCOUNTER — Encounter: Payer: Self-pay | Admitting: Dermatology

## 2020-11-23 ENCOUNTER — Ambulatory Visit: Payer: BC Managed Care – PPO | Admitting: Dermatology

## 2020-11-23 ENCOUNTER — Other Ambulatory Visit: Payer: Self-pay

## 2020-11-23 DIAGNOSIS — L700 Acne vulgaris: Secondary | ICD-10-CM

## 2020-11-23 DIAGNOSIS — D229 Melanocytic nevi, unspecified: Secondary | ICD-10-CM

## 2020-11-23 DIAGNOSIS — D225 Melanocytic nevi of trunk: Secondary | ICD-10-CM

## 2020-11-23 DIAGNOSIS — L905 Scar conditions and fibrosis of skin: Secondary | ICD-10-CM | POA: Diagnosis not present

## 2020-11-23 DIAGNOSIS — D485 Neoplasm of uncertain behavior of skin: Secondary | ICD-10-CM

## 2020-11-23 HISTORY — DX: Melanocytic nevi, unspecified: D22.9

## 2020-11-23 MED ORDER — WINLEVI 1 % EX CREA: TOPICAL_CREAM | CUTANEOUS | 3 refills | Status: DC

## 2020-11-23 MED ORDER — DOXYCYCLINE HYCLATE 100 MG PO TABS: ORAL_TABLET | ORAL | 2 refills | Status: DC

## 2020-11-23 MED ORDER — ADAPALENE 0.3 % EX GEL: CUTANEOUS | 2 refills | Status: DC

## 2020-11-23 NOTE — Progress Notes (Signed)
   Follow-Up Visit   Subjective  Raven Reed is a 25 y.o. female who presents for the following: Acne (Patient currently using Doxycycline 100mg  po QD and Adapalene 0.3% gel QHS ).  The following portions of the chart were reviewed this encounter and updated as appropriate:   Tobacco  Allergies  Meds  Problems  Med Hx  Surg Hx  Fam Hx     Review of Systems:  No other skin or systemic complaints except as noted in HPI or Assessment and Plan.  Objective  Well appearing patient in no apparent distress; mood and affect are within normal limits.  A focused examination was performed including the face and pubic area. Relevant physical exam findings are noted in the Assessment and Plan.  Objective  L pubic area: 0.7 x 0.5 cm brown papule  Assessment & Plan  Acne vulgaris Face  Chronic and persistent not at goal -   Discussed oral Spironolactone vs topical Winlevi vs Isotretinoin. Patient doesn't want to start Isotretinoin until all other options have been tried.   Start Winlevi BID. Continue Doxycycline 100mg  po QD and Adapalene 0.3% gel QHS. Doxycycline should be taken with food to prevent nausea. Do not lay down for 30 minutes after taking. Be cautious with sun exposure and use good sun protection while on this medication. Pregnant women should not take this medication.    Clascoterone (WINLEVI) 1 % CREA - Face  doxycycline (VIBRA-TABS) 100 MG tablet - Face  Reordered Medications Adapalene (DIFFERIN) 0.3 % gel  Neoplasm of uncertain behavior of skin L pubic area  Epidermal / dermal shaving  Lesion diameter (cm):  0.7 Informed consent: discussed and consent obtained   Timeout: patient name, date of birth, surgical site, and procedure verified   Procedure prep:  Patient was prepped and draped in usual sterile fashion Prep type:  Isopropyl alcohol Anesthesia: the lesion was anesthetized in a standard fashion   Anesthetic:  1% lidocaine w/ epinephrine 1-100,000  buffered w/ 8.4% NaHCO3 Instrument used: flexible razor blade   Hemostasis achieved with: pressure, aluminum chloride and electrodesiccation   Outcome: patient tolerated procedure well   Post-procedure details: sterile dressing applied and wound care instructions given   Dressing type: bandage and petrolatum    Specimen 1 - Surgical pathology Differential Diagnosis: D48.5 irritated nevus r/o dysplasia Check Margins: No 0.7 x 0.5 cm brown papule   Previously bx by [facility] who states "The pathology shows: Compound melanocytic nevus on left mons."  Return in about 3 months (around 02/22/2021) for acne follow up .  [name], CMA, am acting as scribe for Sarina Ser, MD .  Documentation: I have reviewed the above documentation for accuracy and completeness, and I agree with the above.  Sarina Ser, MD

## 2020-11-23 NOTE — Patient Instructions (Signed)
If you have any questions or concerns for your doctor, please call our main line at [phone] and press option 4 to reach your doctor's medical assistant. If no one answers, please leave a voicemail as directed and we will return your call as soon as possible. Messages left after 4 pm will be answered the following business day.   You may also send us a message via MyChart. We typically respond to MyChart messages within 1-2 business days.  For prescription refills, please ask your pharmacy to contact our office. Our fax number is [phone].  If you have an urgent issue when the clinic is closed that cannot wait until the next business day, you can page your doctor at the number below.    Please note that while we do our best to be available for urgent issues outside of office hours, we are not available 24/7.   If you have an urgent issue and are unable to reach us, you may choose to seek medical care at your doctor's office, retail clinic, urgent care center, or emergency room.  If you have a medical emergency, please immediately call 911 or go to the emergency department.  Pager Numbers  - Dr. Kowalski: [phone]  - Dr. Moye: [phone]  - Dr. Stewart: [phone]  In the event of inclement weather, please call our main line at [phone] for an update on the status of any delays or closures.  Dermatology Medication Tips: Please keep the boxes that topical medications come in in order to help keep track of the instructions about where and how to use these. Pharmacies typically print the medication instructions only on the boxes and not directly on the medication tubes.   If your medication is too expensive, please contact our office at [phone] option 4 or send us a message through MyChart.   We are unable to tell what your co-pay for medications will be in advance as this is different depending on your insurance coverage. However, we may be able to find a  substitute medication at lower cost or fill out paperwork to get insurance to cover a needed medication.   If a prior authorization is required to get your medication covered by your insurance company, please allow us 1-2 business days to complete this process.  Drug prices often vary depending on where the prescription is filled and some pharmacies may offer cheaper prices.  The website www.goodrx.com contains coupons for medications through different pharmacies. The prices here do not account for what the cost may be with help from insurance (it may be cheaper with your insurance), but the website can give you the price if you did not use any insurance.  - You can print the associated coupon and take it with your prescription to the pharmacy.  - You may also stop by our office during regular business hours and pick up a GoodRx coupon card.  - If you need your prescription sent electronically to a different pharmacy, notify our office through [facility] MyChart or by phone at [phone] option 4.     Wound Care Instructions  1. Cleanse wound gently with soap and water once a day then pat dry with clean gauze. Apply a thing coat of Petrolatum (petroleum jelly, "Vaseline") over the wound (unless you have an allergy to this). We recommend that you use a new, sterile tube of Vaseline. Do not pick or remove scabs. Do not remove the yellow or white "healing tissue" from the base of the wound.    2. Cover the wound with fresh, clean, nonstick gauze and secure with paper tape. You may use Band-Aids in place of gauze and tape if the would is small enough, but would recommend trimming much of the tape off as there is often too much. Sometimes Band-Aids can irritate the skin.  3. You should call the office for your biopsy report after 1 week if you have not already been contacted.  4. If you experience any problems, such as abnormal amounts of bleeding, swelling, significant bruising, significant pain,  or evidence of infection, please call the office immediately.  5. FOR ADULT SURGERY PATIENTS: If you need something for pain relief you may take 1 extra strength Tylenol (acetaminophen) AND 2 Ibuprofen (200mg each) together every 4 hours as needed for pain. (do not take these if you are allergic to them or if you have a reason you should not take them.) Typically, you may only need pain medication for 1 to 3 days.     

## 2020-11-24 ENCOUNTER — Other Ambulatory Visit: Payer: Self-pay | Admitting: Dermatology

## 2020-11-24 DIAGNOSIS — D225 Melanocytic nevi of trunk: Secondary | ICD-10-CM | POA: Diagnosis not present

## 2020-11-24 DIAGNOSIS — L905 Scar conditions and fibrosis of skin: Secondary | ICD-10-CM | POA: Diagnosis not present

## 2020-11-27 ENCOUNTER — Encounter: Payer: Self-pay | Admitting: Dermatology

## 2020-11-28 ENCOUNTER — Telehealth: Payer: Self-pay

## 2020-11-28 NOTE — Telephone Encounter (Signed)
Discussed biopsy results with pt  °

## 2020-11-28 NOTE — Telephone Encounter (Signed)
-----   Message from Ralene Bathe, MD sent at 11/28/2020  9:02 AM EDT ----- Diagnosis Skin , left pubic area [place] WITH SCAR AND PERSISTENT NEVUS-LIKE CHANGES, DEEP MARGIN INVOLVED, SEE DESCRIPTION  Dysplastic Moderate Recheck after heals. Possibility of recurrence, but no further treatment at this time.

## 2021-02-17 DIAGNOSIS — J039 Acute tonsillitis, unspecified: Secondary | ICD-10-CM | POA: Diagnosis not present

## 2021-02-17 DIAGNOSIS — M791 Myalgia, unspecified site: Secondary | ICD-10-CM | POA: Diagnosis not present

## 2021-02-17 DIAGNOSIS — R509 Fever, unspecified: Secondary | ICD-10-CM | POA: Diagnosis not present

## 2021-02-22 ENCOUNTER — Ambulatory Visit: Payer: BC Managed Care – PPO | Admitting: Dermatology

## 2021-04-16 IMAGING — US US SOFT TISSUE HEAD/NECK
1 series · 14 of 21 positions shown · non-contrast
Comparison: None.

CLINICAL DATA: Neck mass x9 months

EXAM:
ULTRASOUND OF HEAD/NECK SOFT TISSUES
TECHNIQUE: Ultrasound examination of the head and neck soft tissues was
performed in the area of clinical concern.

[Series 1: us soft tissue head/neck · 0.07mm/px · 21 acquisitions, 14 frames shown]
[im 1/21]
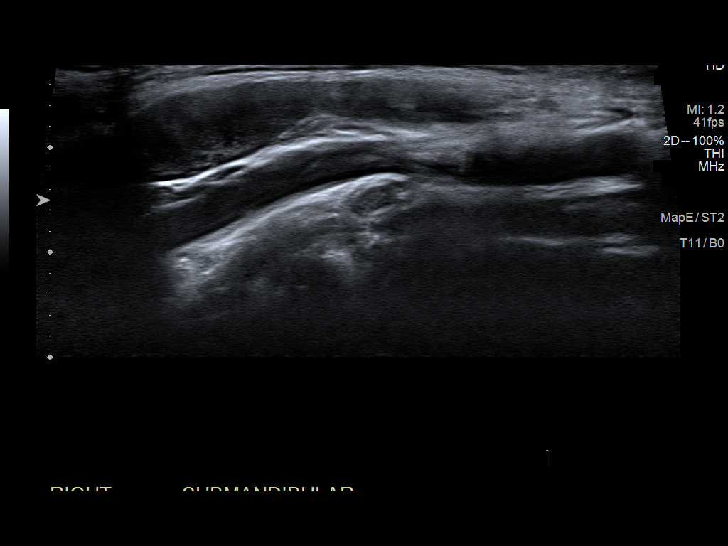
[im 3/21]
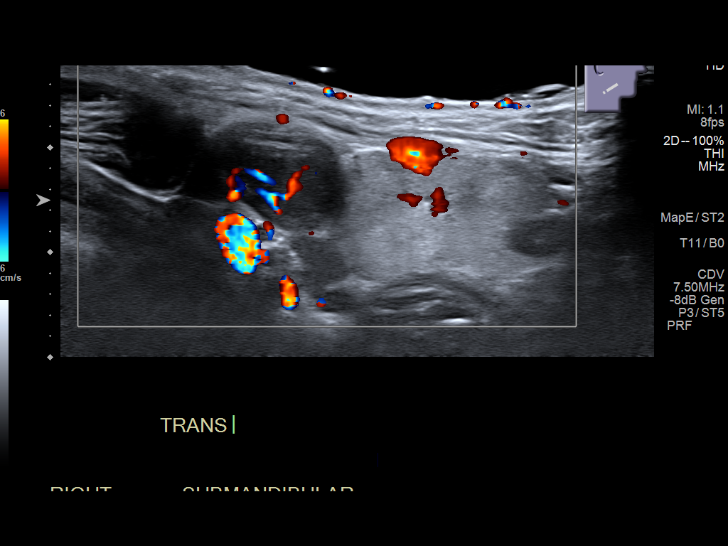
[im 4/21]
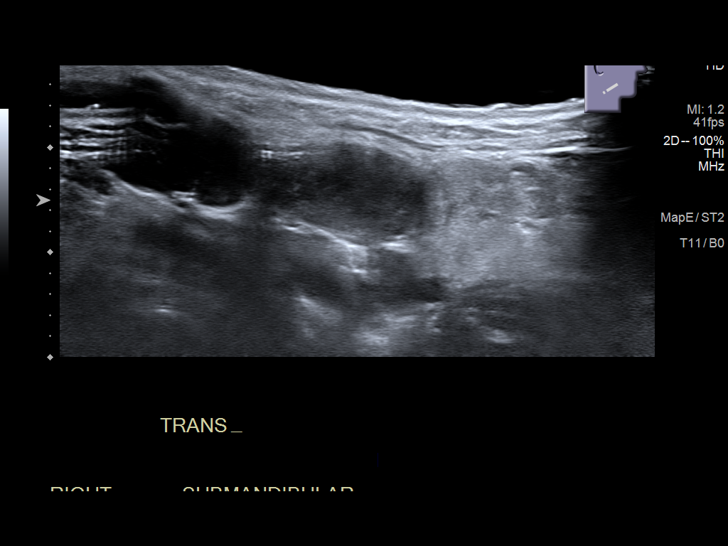
[im 6/21]
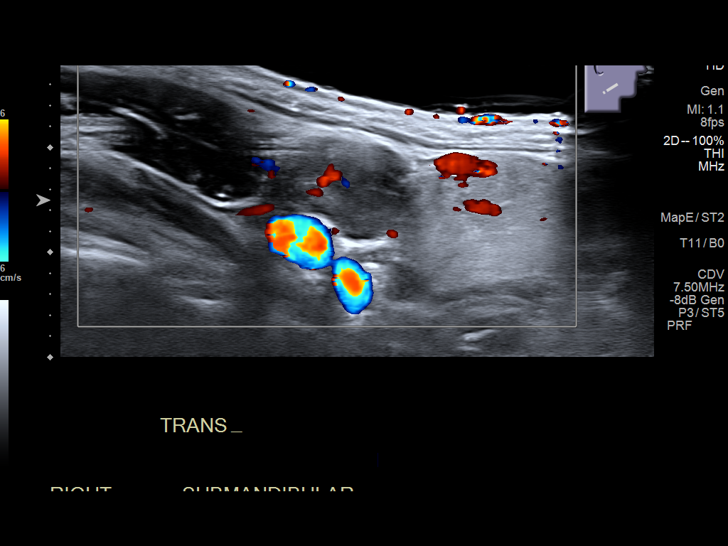
[im 7/21]
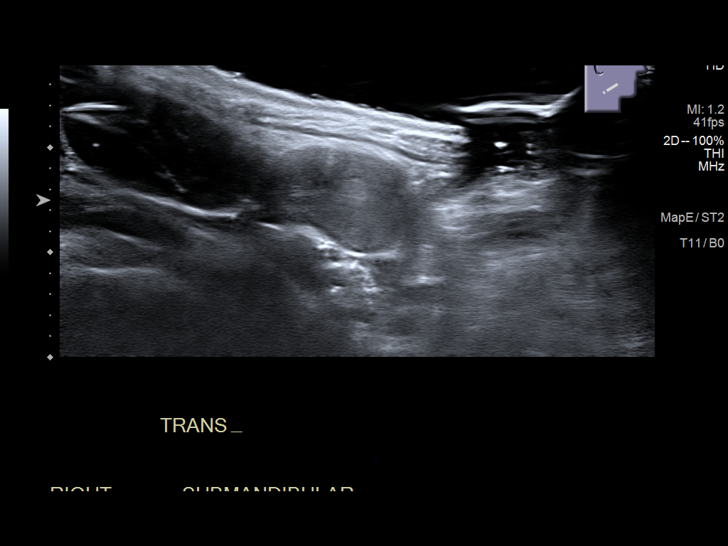
[im 9/21]
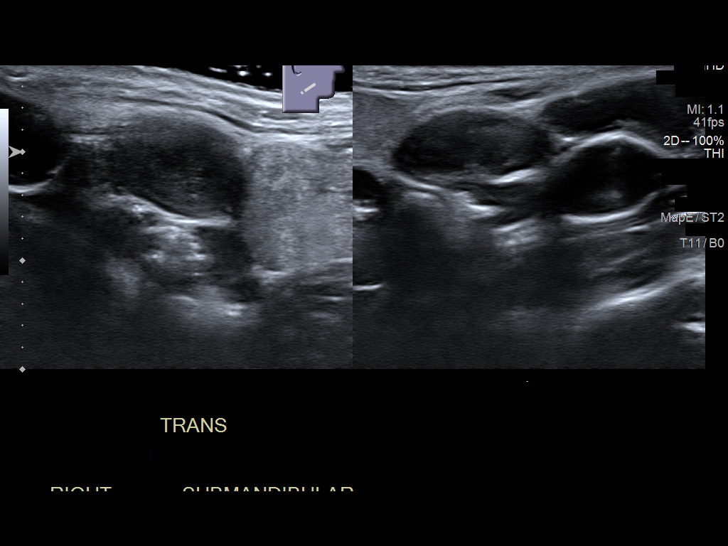
[im 10/21]
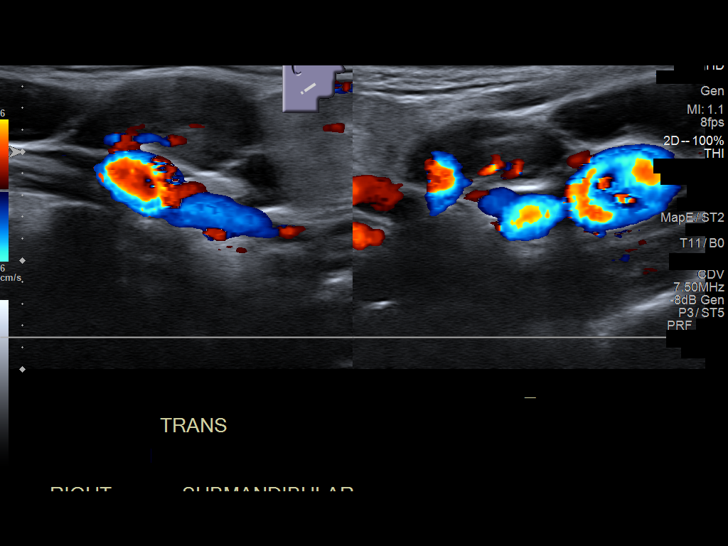
[im 12/21]
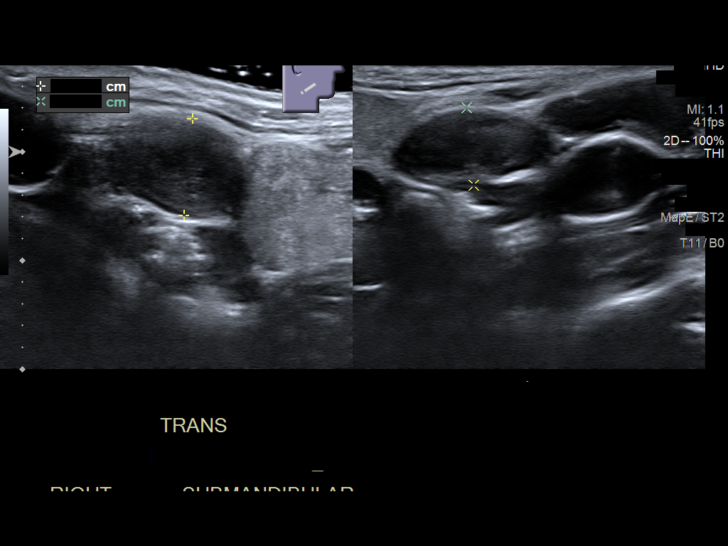
[im 13/21]
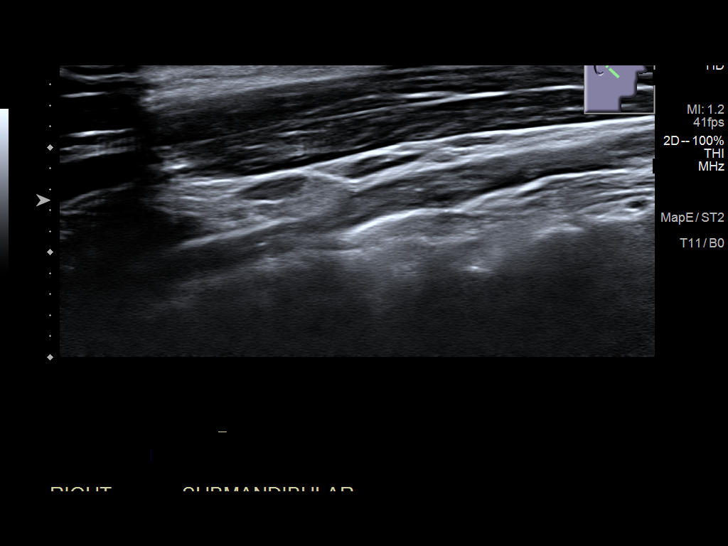
[im 15/21]
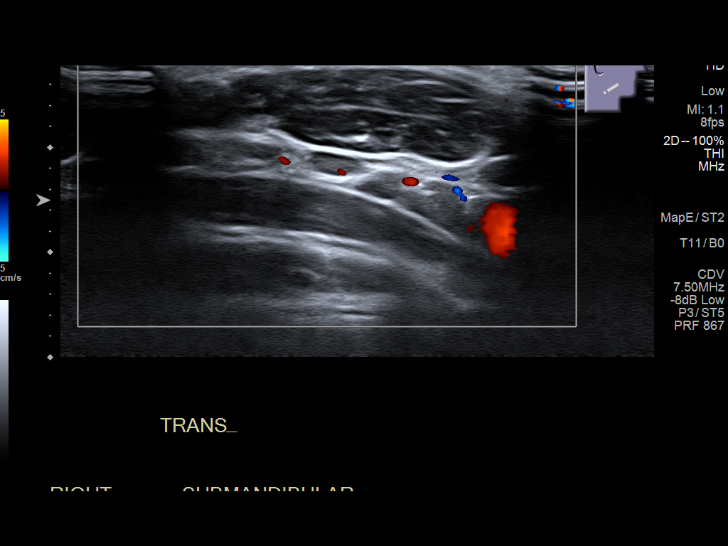
[im 16/21]
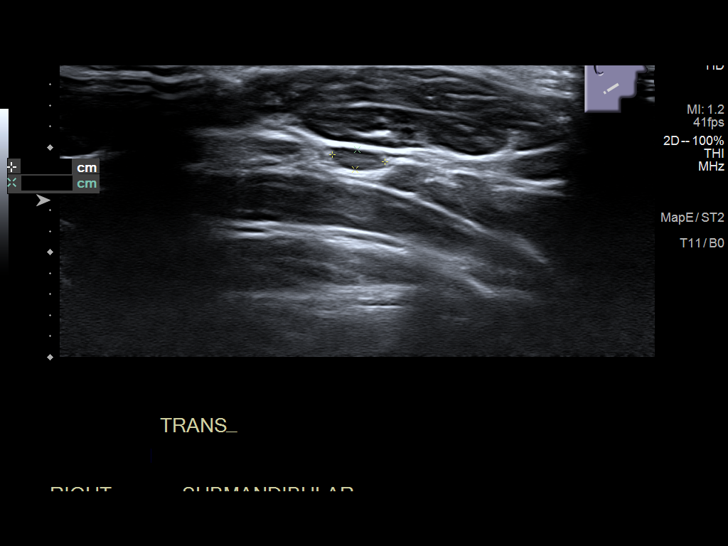
[im 18/21]
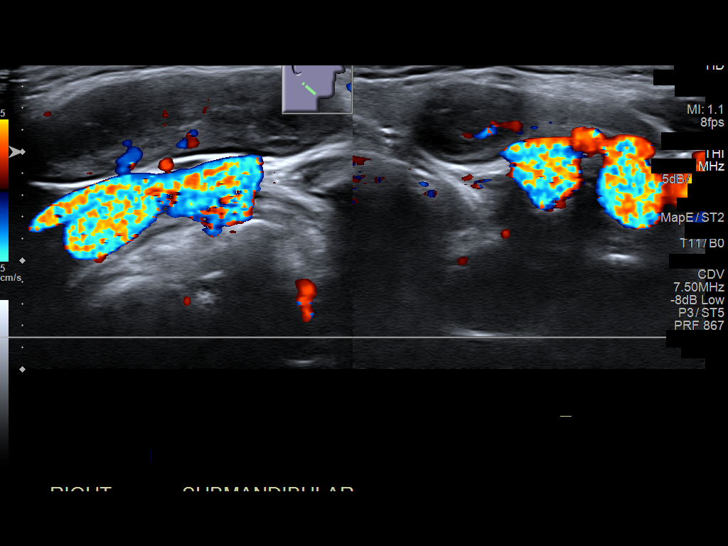
[im 19/21]
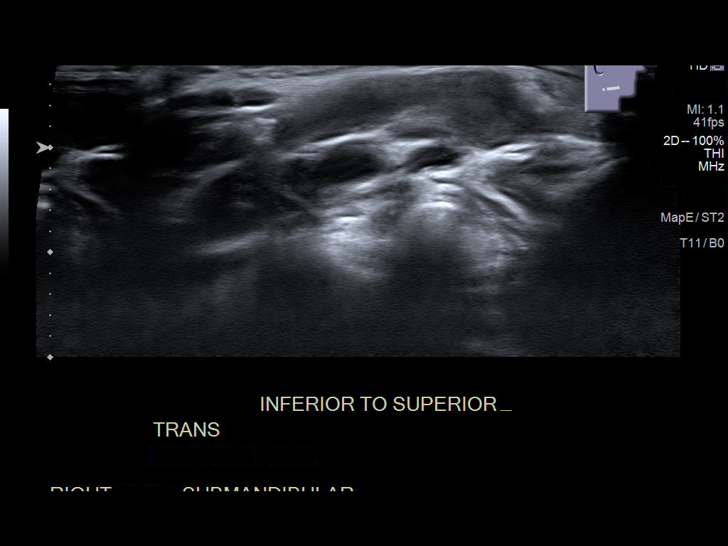
[im 21/21]
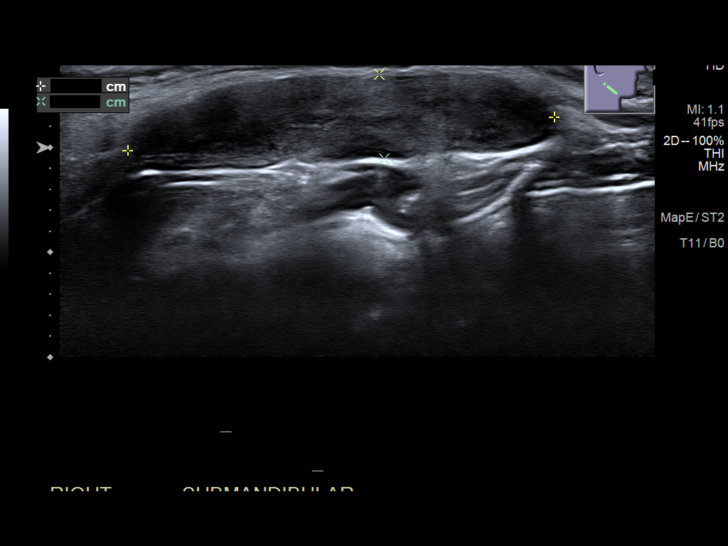

[14 of 21 positions shown; findings below may reference images not displayed]

FINDINGS: In the patient's palpable area of concern in the right submandibular
region there is a 4.1 x 0.8 cm hypoechoic mass with internal color
Doppler flow. This mass does not appear to demonstrate a normal
fatty hilum. This mass appears to be separate from the submandibular
gland. In the left submandibular region there are slightly enlarged
lymph nodes measuring approximately 0.9 x 0.7 cm. These lymph nodes
appear to demonstrate a normal fatty hilum.
IMPRESSION: The patient's palpable area of concern in the right submandibular
region appears to correspond to an enlarged lymph node as detailed
above. A normal fatty hilum is not clearly identified within this
this lymph node. While this may represent an enlarged reactive lymph
node, follow-up is recommended. Consider short-term interval
follow-up ultrasound or contrast-enhanced CT/MRI. Alternatively,
this lymph node is amenable to percutaneous biopsy as clinically
indicated.

## 2021-04-25 IMAGING — US US BIOPSY LYMPH NODE
1 series · 13 of 15 positions shown · non-contrast
Comparison: none

INDICATION: 23-year-old female with a history of right cervical lymph node with
questionable lymphoma versus infection

[Series 1: us biopsy lymph node · 0.05mm/px · 13 of 15 slices shown]
[im 1/15]
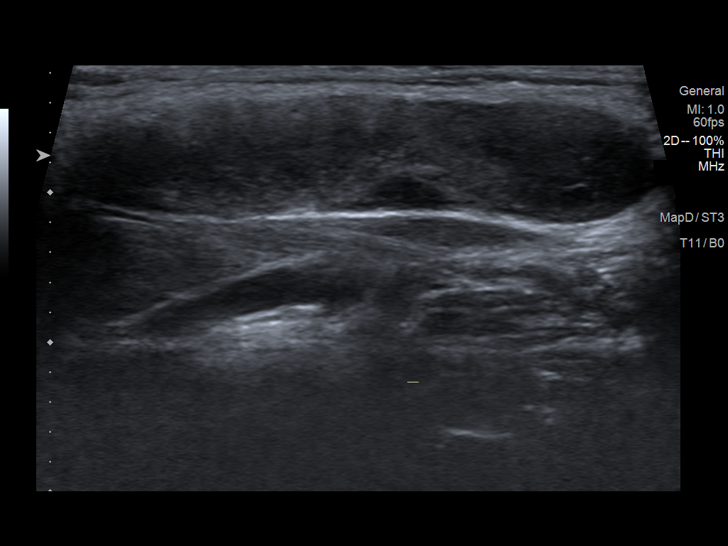
[im 2/15]
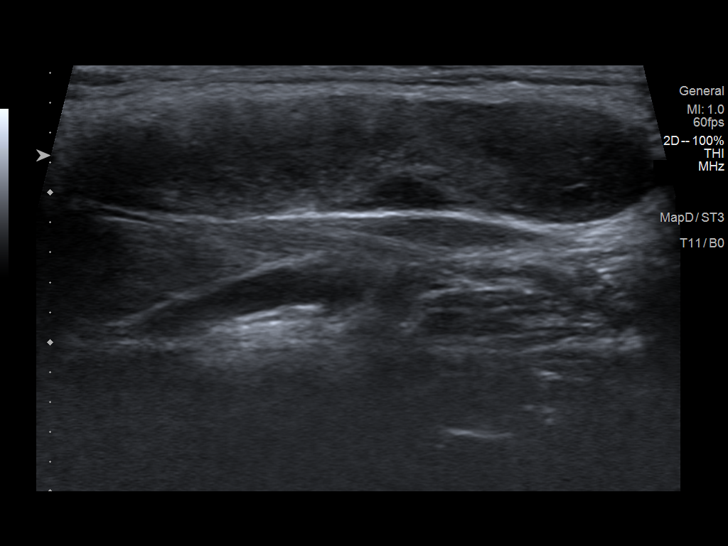
[im 3/15]
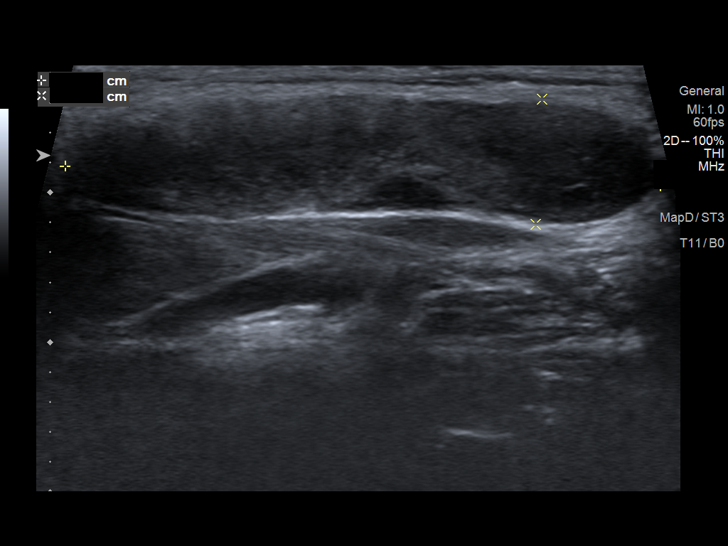
[im 5/15]
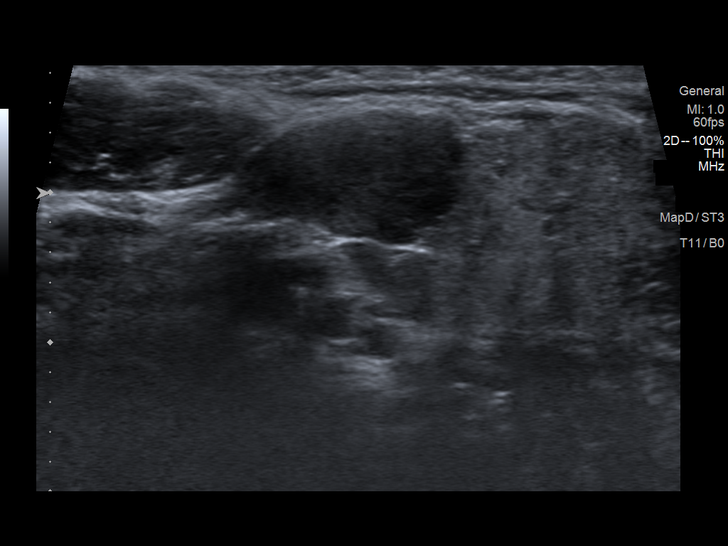
[im 6/15]
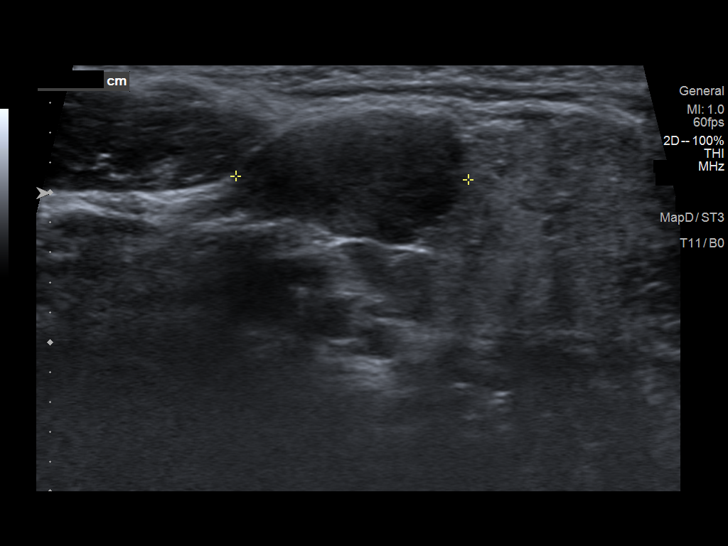
[im 7/15]
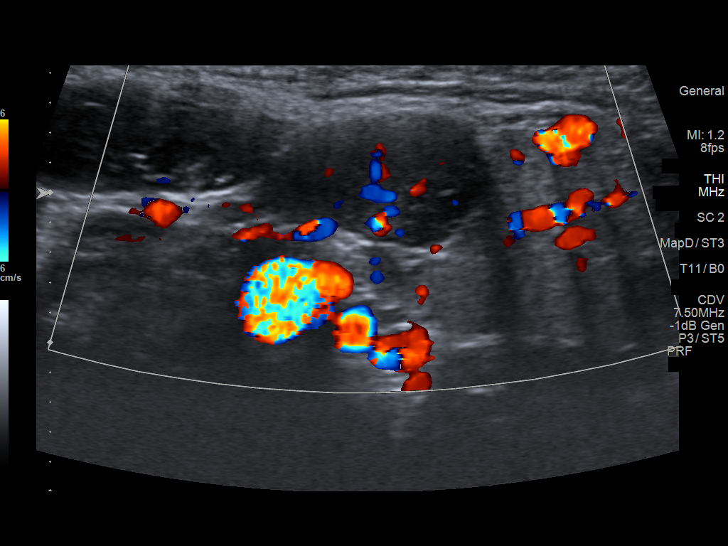
[im 8/15]
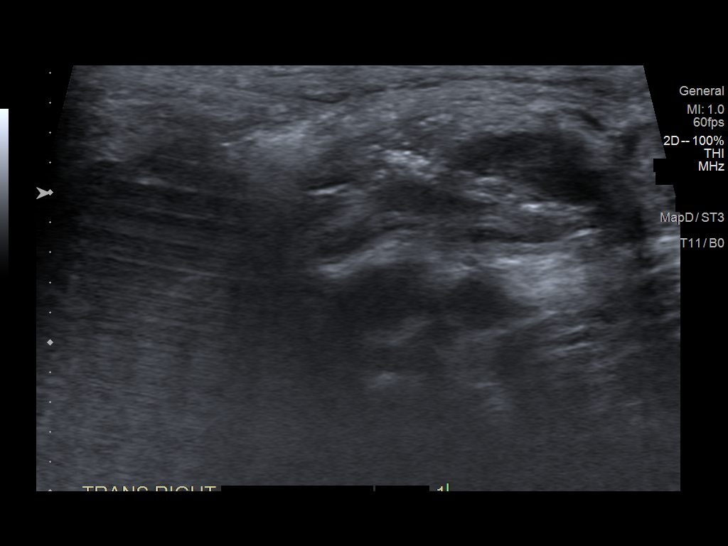
[im 9/15]
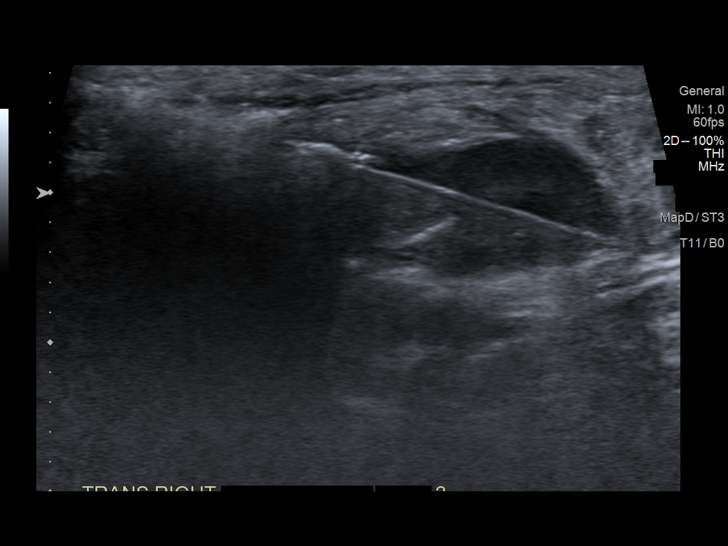
[im 10/15]
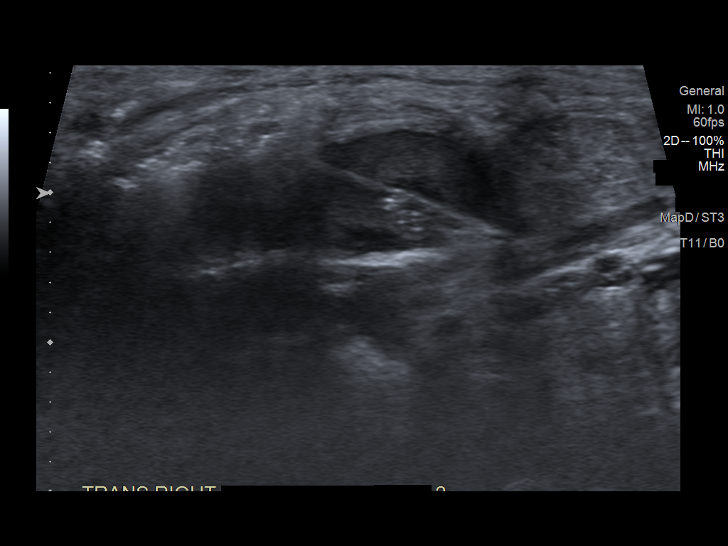
[im 11/15]
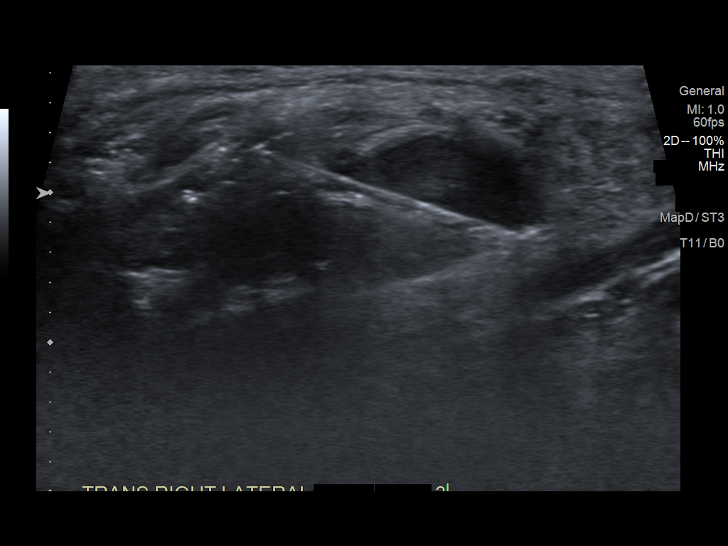
[im 13/15]
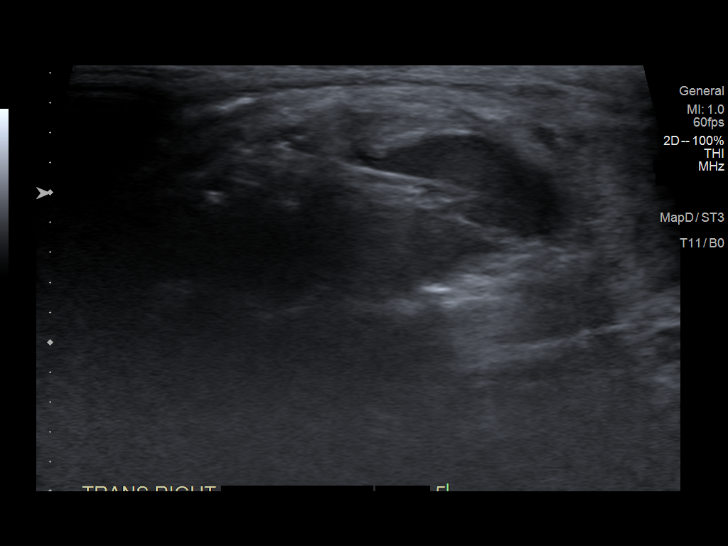
[im 14/15]
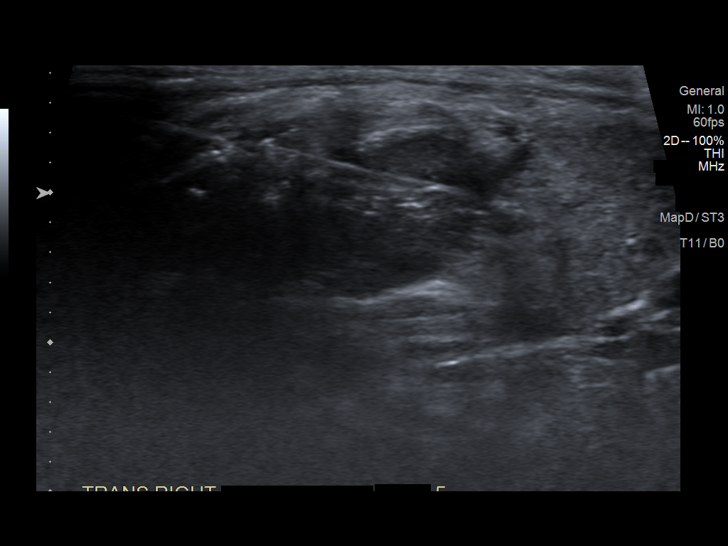
[im 15/15]
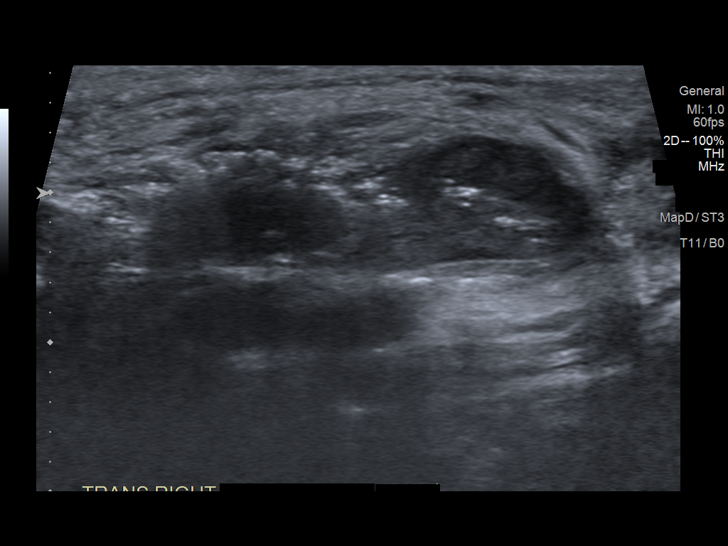

[13 of 15 positions shown; findings below may reference images not displayed]

EXAM:
ULTRASOUND-GUIDED BIOPSY

MEDICATIONS:
None.

ANESTHESIA/SEDATION:
None

FLUOROSCOPY TIME:  None

COMPLICATIONS:
None

PROCEDURE:
Informed written consent was obtained from the patient after a
thorough discussion of the procedural risks, benefits and
alternatives. All questions were addressed. Maximal Sterile Barrier
Technique was utilized including caps, mask, sterile gowns, sterile
gloves, sterile drape, hand hygiene and skin antiseptic. A timeout
was performed prior to the initiation of the procedure.

Patient positioned supine position. Ultrasound survey was performed
of the right submandibular region with identification of the
questionable lymph node.

Patient is prepped and draped in the usual sterile fashion. 1%
lidocaine was used for local anesthesia.

Using ultrasound guidance, a 17 gauge guide was advanced from a
posterior-anterior trajectory, tangential to the underlying carotid
branches. The guide was placed into the inferior aspect of the node,
in the safest trajectory possible. Stylet was removed and then
multiple 18 gauge side-fire biopsy were acquired. Tissue was placed
on to Telfa pad with saline as a fresh specimen for both pathology
and for a culture to the lab.

Guide needle was removed.

Final images were stored.

Sterile bandage was placed.

Patient tolerated the procedure well and remained hemodynamically
stable throughout.

No complications were encountered and no significant blood loss.
IMPRESSION: Status post ultrasound-guided biopsy of right submandibular lymph
node. Tissue sent for pathology and for lab evaluation.

## 2021-05-16 IMAGING — US US BIOPSY FNA W/ IMAGING
1 series · 13 of 25 positions shown · non-contrast
Comparison: Soft tissue neck ultrasound-06/29/2019;

INDICATION: No known primary, now with palpable high right cervical lymph node.
Patient underwent ultrasound-guided core needle biopsy of this lymph
node on 07/08/2019 with nondiagnostic results. As such, patient
returns today for repeat ultrasound-guided lymph node fine-needle
aspiration and core needle biopsy.

EXAM:
ULTRASOUND-GUIDED BIOPSY OF INDETERMINATE HIGH RIGHT CERVICAL LYMPH
NODE
TECHNIQUE: Informed written consent was obtained from the patient after a
discussion of the risks, benefits and alternatives to treatment.
Questions regarding the procedure were encouraged and answered.
Initial ultrasound scanning demonstrated grossly unchanged
appearance of the approximately 3.5 x 0.7 x 1.9 cm lymph node within
the cranial aspect of the right-side of the neck, inferior and
separate from the adjacent right submandibular gland. An ultrasound
image was saved for documentation purposes. The procedure was
planned. A timeout was performed prior to the initiation of the
procedure.

[Series 1: us biopsy fna w/ imaging · 0.04mm/px · 25 acquisitions, 13 frames shown]
[im 1/25]
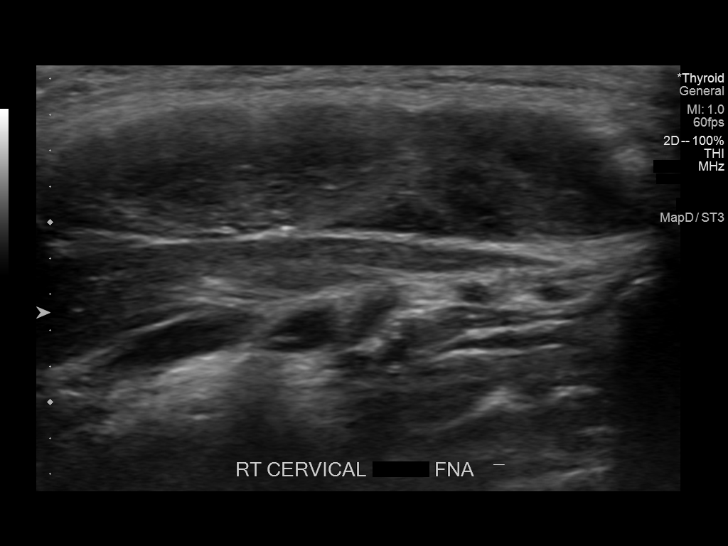
[im 3/25]
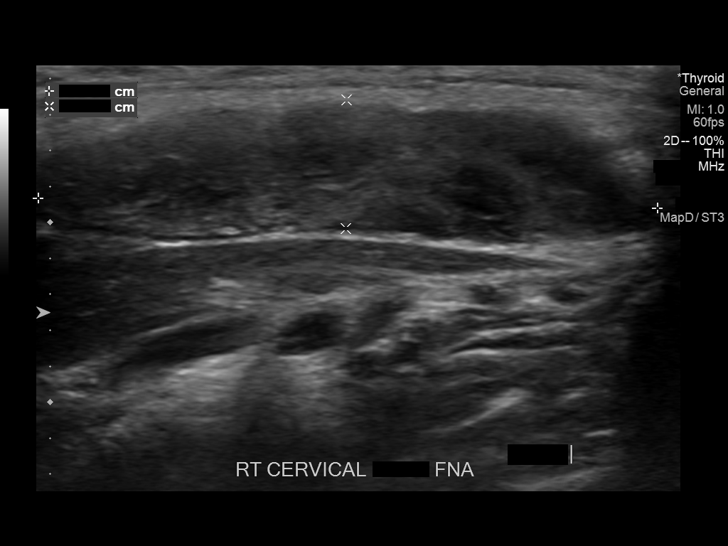
[im 5/25]
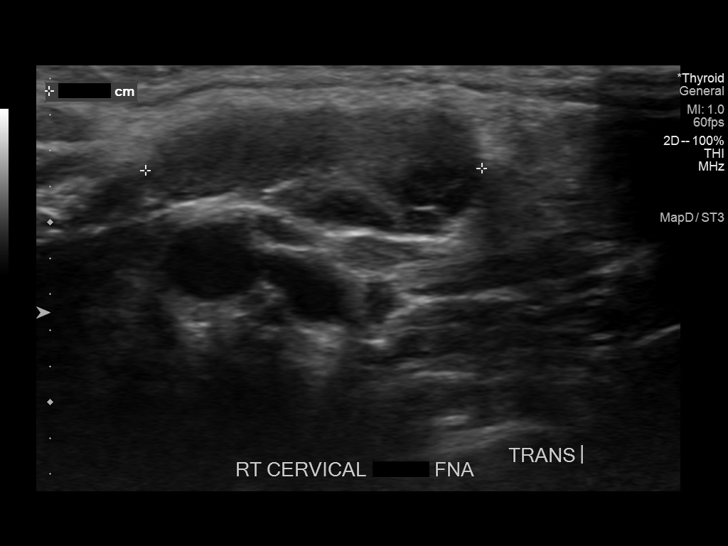
[im 7/25]
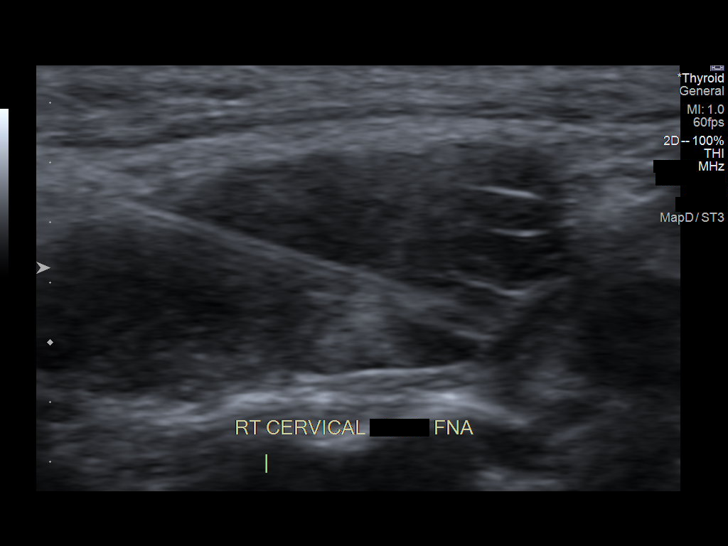
[im 9/25]
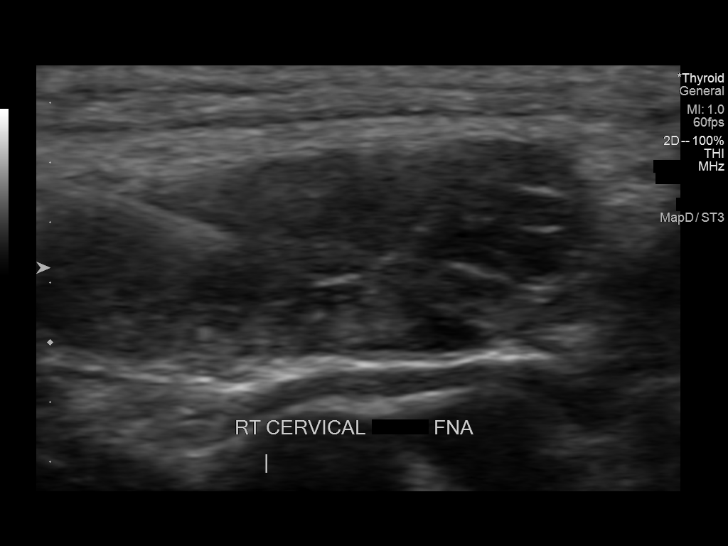
[im 11/25]
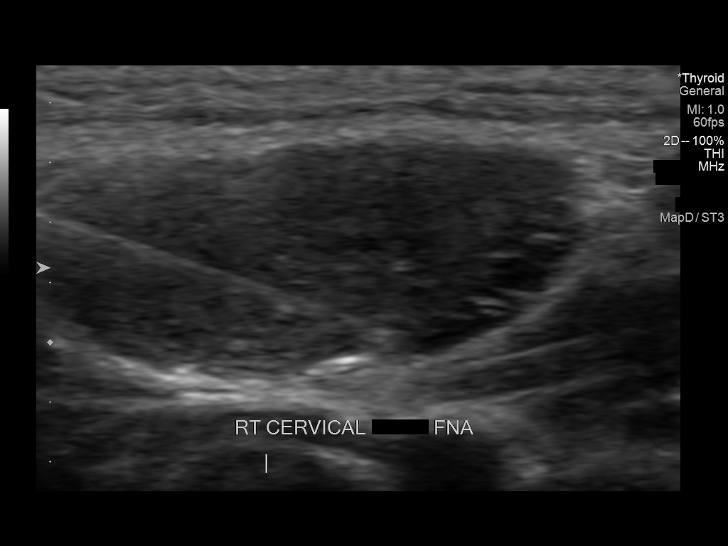
[im 13/25]
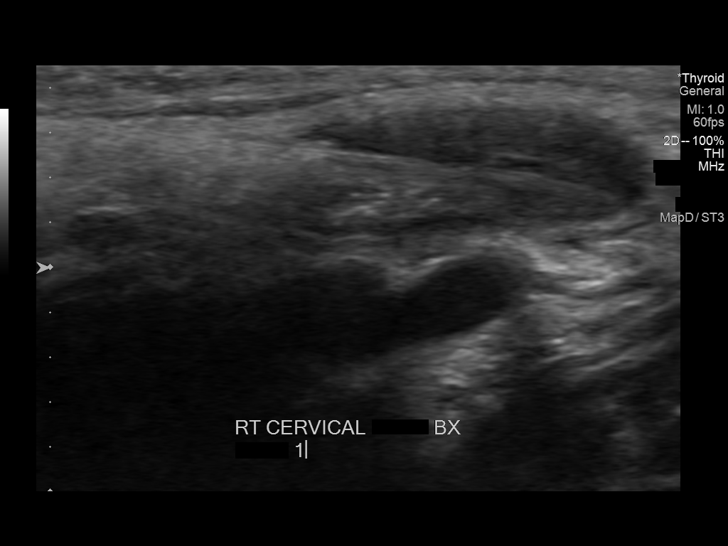
[im 15/25]
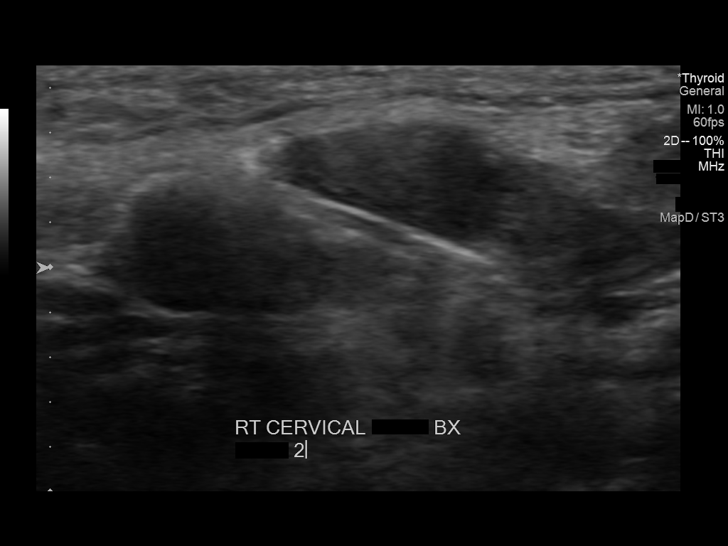
[im 17/25]
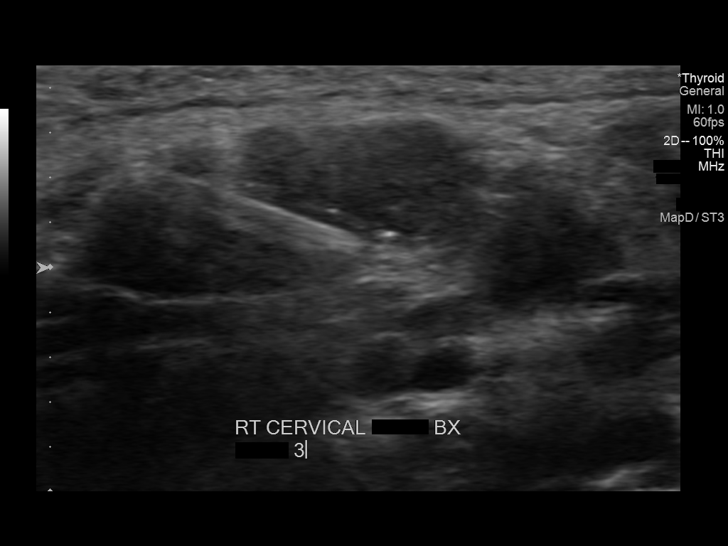
[im 19/25]
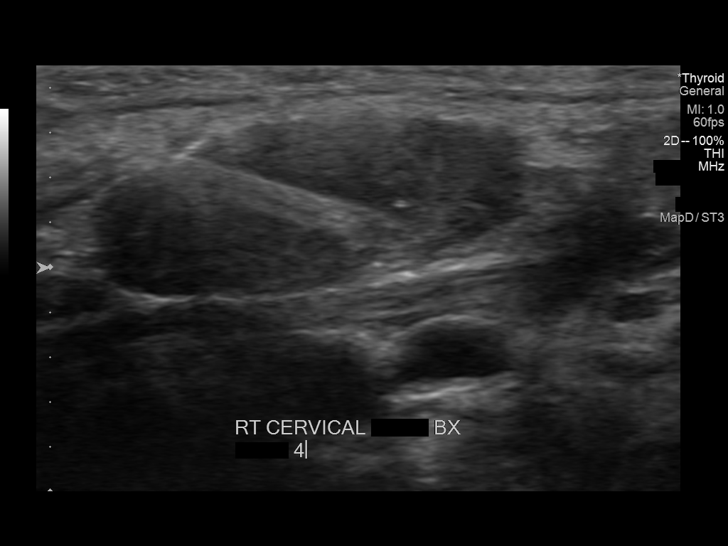
[im 21/25]
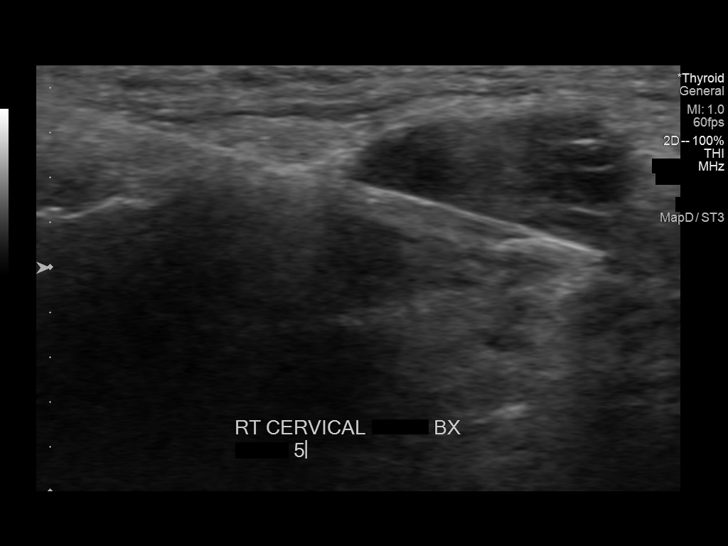
[im 23/25]
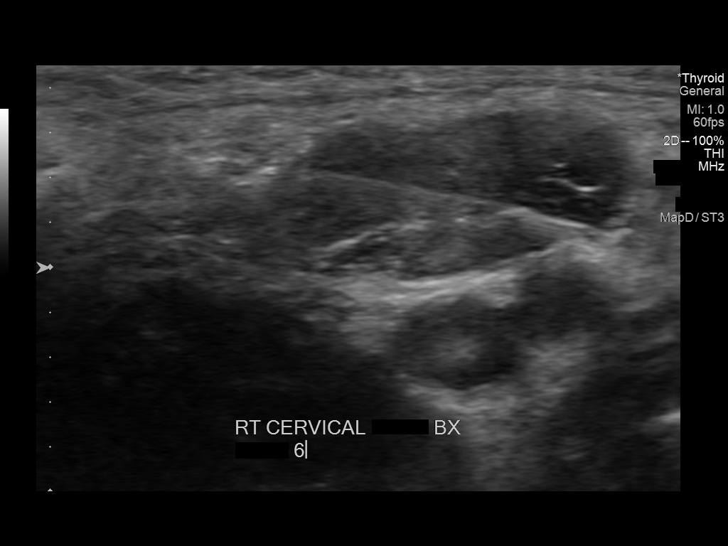
[im 25/25]
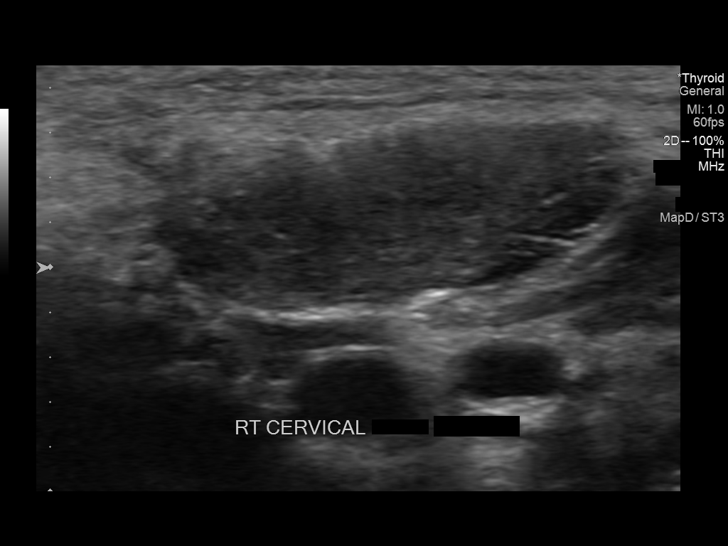

[13 of 25 positions shown; findings below may reference images not displayed]

ultrasound-guided cervical lymph node core needle biopsy-07/08/2019

MEDICATIONS:
None

ANESTHESIA/SEDATION:
None, per patient request

COMPLICATIONS:
None immediate.
The operative was prepped and draped in the usual sterile fashion,
and a sterile drape was applied covering the operative field. A
timeout was performed prior to the initiation of the procedure.
Local anesthesia was provided with 1% lidocaine with epinephrine.

Under direct ultrasound guidance, 5 fine-needle aspirations were
obtained the indeterminate high right cervical lymph node with a 27
gauge needle. Samples were prepared by the cytotechnologists and
submitted to pathology.

Next, under direct ultrasound guidance, an 18 gauge core needle
device was utilized to obtain to obtain 6 core needle biopsies of
the indeterminate high right cervical lymph node.

The samples were placed in saline and submitted to pathology. The
needle was removed and hemostasis was achieved with manual
compression. Post procedure scan was negative for significant
hematoma. A dressing was placed. The patient tolerated the procedure
well without immediate postprocedural complication.
IMPRESSION: Technically successful ultrasound guided fine needle aspiration and
core needle biopsy of dominant indeterminate high right cervical
lymph node.

## 2021-06-05 DIAGNOSIS — R112 Nausea with vomiting, unspecified: Secondary | ICD-10-CM | POA: Diagnosis not present

## 2021-06-05 DIAGNOSIS — J101 Influenza due to other identified influenza virus with other respiratory manifestations: Secondary | ICD-10-CM | POA: Diagnosis not present

## 2021-08-21 DIAGNOSIS — Z20822 Contact with and (suspected) exposure to covid-19: Secondary | ICD-10-CM | POA: Diagnosis not present

## 2021-09-18 ENCOUNTER — Ambulatory Visit: Payer: BC Managed Care – PPO | Admitting: Obstetrics and Gynecology

## 2021-11-08 NOTE — Progress Notes (Signed)
? ? ?Chief Complaint  ?Patient presents with  ? Gynecologic Exam  ?  No concerns  ? ? ?HPI: ?     [name] is a 26 y.o. [id number] who LMP was Patient's last menstrual period was 11/06/2021 (exact date)., presents today for her annual examination. Her menses are regular every 28-30 days, lasting 6-7 days. Dysmenorrhea mild, occurring first 1-2 days of flow. No BTB. Went off OCPs about 5 months ago due to AM n/v (even though taking them at night). Stopped pills and sx resolved. Did same thing last yr and changed from estarylla to aviane with same sx. Tried xulane last yr but had mood changes. Interested in [facility] options, doesn't want depo or IUD. Hx of menorrhagia and anemia in past. ? ?Sex activity: currently sex active, contraception - condoms now but was doing OCPs. Wants to restart them. ?Last pap: 07/06/20 Results were no abnormalities ?Hx of STDs: none ?  ?There is no FH of breast cancer. There is no FH of ovarian cancer. The patient does do self-breast exams. ?  ?Tobacco use: The patient denies current or previous tobacco use. ?Alcohol use: social ?No drug use.  ?Exercise: mod active ?  ?She does get adequate calcium but not Vitamin D in her diet. ? ?Has had increased anxiety past few months with work/stressors. Tries to calm down with running, reading a book. Works for [employer] in [employer]. Hasn't tried counselor/therapist yet.  ? ? ?Past Medical History:  ?Diagnosis Date  ? Atypical mole 11/23/2020  ? left pubic area, moderate Atypia   ? Menometrorrhagia   ? ? ?Past Surgical History:  ?Procedure Laterality Date  ? WISDOM TOOTH EXTRACTION  11/2017  ? ? ?Family History  ?Problem Relation Age of Onset  ? Diabetes Paternal Grandfather   ? Parkinson's disease Paternal Grandfather   ? Diabetes Maternal Grandmother   ? Heart attack Maternal Grandmother   ? ? ?Social History  ? ?Socioeconomic History  ? Marital status: Single  ?  Spouse name: Not on file  ? Number of children: Not on file  ? Years  of education: Not on file  ? Highest education level: Not on file  ?Occupational History  ? Not on file  ?Tobacco Use  ? Smoking status: Never  ? Smokeless tobacco: Never  ?Vaping Use  ? Vaping Use: Never used  ?Substance and Sexual Activity  ? Alcohol use: Yes  ?  Comment: occasionally once a week  ? Drug use: No  ? Sexual activity: Yes  ?  Birth control/protection: None, Condom  ?Other Topics Concern  ? Not on file  ?Social History Narrative  ? Lives alone in apartment  ? ?Social Determinants of Health  ? ?Financial Resource Strain: Not on file  ?Food Insecurity: Not on file  ?Transportation Needs: Not on file  ?Physical Activity: Not on file  ?Stress: Not on file  ?Social Connections: Not on file  ?Intimate Partner Violence: Not on file  ? ? ?Current Outpatient Medications on File Prior to Visit  ?Medication Sig Dispense Refill  ? VENTOLIN HFA 108 (90 Base) MCG/ACT inhaler SMARTSIG:2 Puff(s) By Mouth Every 4 Hours PRN    ? ?No current facility-administered medications on file prior to visit.  ? ? ?ROS: ? ?Review of Systems  ?Constitutional:  Negative for fatigue, fever and unexpected weight change.  ?Respiratory:  Negative for cough, shortness of breath and wheezing.   ?Cardiovascular:  Negative for chest pain, palpitations and leg swelling.  ?Gastrointestinal:  Negative for blood in stool, constipation, diarrhea, nausea and vomiting.  ?Endocrine: Negative for cold intolerance, heat intolerance and polyuria.  ?Genitourinary:  Negative for dyspareunia, dysuria, flank pain, frequency, genital sores, hematuria, menstrual problem, pelvic pain, urgency, vaginal bleeding, vaginal discharge and vaginal pain.  ?Musculoskeletal:  Negative for back pain, joint swelling and myalgias.  ?Skin:  Negative for rash.  ?Neurological:  Negative for dizziness, syncope, light-headedness, numbness and headaches.  ?Hematological:  Negative for adenopathy.  ?Psychiatric/Behavioral:  Positive for agitation. Negative for confusion, sleep  disturbance and suicidal ideas. The patient is not nervous/anxious.   ? ? ?Objective: ?BP 110/70   Ht '5\' 11"'$  (1.803 m)   Wt 130 lb (59 kg)   LMP 11/06/2021 (Exact Date)   BMI 18.13 kg/m?  ? ? ?Physical Exam ?Constitutional:   ?   Appearance: She is well-developed.  ?Genitourinary:  ?   Vulva normal.  ?   Right Labia: No rash, tenderness or lesions. ?   Left Labia: No tenderness, lesions or rash. ?   Vaginal bleeding present.  ?   No vaginal discharge, erythema or tenderness.  ? ?   Right Adnexa: not tender and no mass present. ?   Left Adnexa: not tender and no mass present. ?   No cervical friability or polyp.  ?   Uterus is not enlarged or tender.  ?Breasts: ?   Right: No mass, nipple discharge, skin change or tenderness.  ?   Left: No mass, nipple discharge, skin change or tenderness.  ?Neck:  ?   Thyroid: No thyromegaly.  ?Cardiovascular:  ?   Rate and Rhythm: Normal rate and regular rhythm.  ?   Heart sounds: Normal heart sounds. No murmur heard. ?Pulmonary:  ?   Effort: Pulmonary effort is normal.  ?   Breath sounds: Normal breath sounds.  ?Abdominal:  ?   Palpations: Abdomen is soft.  ?   Tenderness: There is no abdominal tenderness. There is no guarding or rebound.  ?Musculoskeletal:     ?   General: Normal range of motion.  ?   Cervical back: Normal range of motion.  ?Lymphadenopathy:  ?   Cervical: No cervical adenopathy.  ?Neurological:  ?   General: No focal deficit present.  ?   Mental Status: She is alert and oriented to person, place, and time.  ?   Cranial Nerves: No cranial nerve deficit.  ?Skin: ?   General: Skin is warm and dry.  ?Psychiatric:     ?   Mood and Affect: Mood normal.     ?   Behavior: Behavior normal.     ?   Thought Content: Thought content normal.     ?   Judgment: Judgment normal.  ?Vitals reviewed.  ? ? ?Assessment/Plan: ?Encounter for annual routine gynecological examination ? ?Screening for STD (sexually transmitted disease) - Plan: Cervicovaginal ancillary  only ? ?Encounter for initial prescription of contraceptive pills - Plan: Norethindrone Acetate-Ethinyl Estrad-FE (MICROGESTIN 24 FE) 1-20 MG-MCG(24) tablet; pt with nausea with 2 different OCPs. Discussed changing to different OCP, POP, nuvaring, nexplanon (doesn't want depo or IUD). Pt wants to try different pill. Rx eRxd. If sx persist will discuss POPs vs nuvaring. Condoms for 1 wk. F/u prn.  ? ?Anxiety--for several months, doing exercise/reading to relax. Suggested EACP due to work stressors. If sx persist, can add Rx med. F/u prn.  ? ? ?Meds ordered this encounter  ?Medications  ? Norethindrone Acetate-Ethinyl Estrad-FE (MICROGESTIN 24 FE) 1-20 MG-MCG(24) tablet  ?  Sig: Take 1 tablet by mouth daily.  ?  Dispense:  84 tablet  ?  Refill:  3  ?  Order Specific Question:   Supervising Provider  ?  AnswerGae Dry [[id number]]  ? ?          ?GYN counsel STD prevention, adequate intake of calcium and vitamin D, diet and exercise ? ? ?  F/U ? Return in about 1 year (around 11/10/2022). ? ?Velvie Thomaston B. Zuha Dejonge, PA-C ?11/09/2021 ?11:18 AM ? ? ? ? ?

## 2021-11-09 ENCOUNTER — Ambulatory Visit (INDEPENDENT_AMBULATORY_CARE_PROVIDER_SITE_OTHER): Payer: BC Managed Care – PPO | Admitting: Obstetrics and Gynecology

## 2021-11-09 ENCOUNTER — Encounter: Payer: Self-pay | Admitting: Obstetrics and Gynecology

## 2021-11-09 ENCOUNTER — Other Ambulatory Visit (HOSPITAL_COMMUNITY)
Admission: RE | Admit: 2021-11-09 | Discharge: 2021-11-09 | Disposition: A | Discharge status: Home / Self Care | Payer: BC Managed Care – PPO | Source: Ambulatory Visit | Attending: Obstetrics and Gynecology | Admitting: Obstetrics and Gynecology

## 2021-11-09 VITALS — BP 110/70 | Ht 71.0 in | Wt 130.0 lb

## 2021-11-09 DIAGNOSIS — F419 Anxiety disorder, unspecified: Secondary | ICD-10-CM

## 2021-11-09 DIAGNOSIS — M9901 Segmental and somatic dysfunction of cervical region: Secondary | ICD-10-CM | POA: Diagnosis not present

## 2021-11-09 DIAGNOSIS — Z3041 Encounter for surveillance of contraceptive pills: Secondary | ICD-10-CM

## 2021-11-09 DIAGNOSIS — M9903 Segmental and somatic dysfunction of lumbar region: Secondary | ICD-10-CM | POA: Diagnosis not present

## 2021-11-09 DIAGNOSIS — Z30011 Encounter for initial prescription of contraceptive pills: Secondary | ICD-10-CM

## 2021-11-09 DIAGNOSIS — M62830 Muscle spasm of back: Secondary | ICD-10-CM | POA: Diagnosis not present

## 2021-11-09 DIAGNOSIS — Z01419 Encounter for gynecological examination (general) (routine) without abnormal findings: Secondary | ICD-10-CM

## 2021-11-09 DIAGNOSIS — M9902 Segmental and somatic dysfunction of thoracic region: Secondary | ICD-10-CM | POA: Diagnosis not present

## 2021-11-09 DIAGNOSIS — Z113 Encounter for screening for infections with a predominantly sexual mode of transmission: Secondary | ICD-10-CM

## 2021-11-09 MED ORDER — MICROGESTIN 24 FE 1-20 MG-MCG PO TABS: 1.0000 | ORAL_TABLET | Freq: Every day | ORAL | 3 refills | Status: DC

## 2021-11-09 NOTE — Patient Instructions (Signed)
I value your feedback and you entrusting us with your care. If you get a [facility] patient survey, I would appreciate you taking the time to let us know about your experience today. Thank you! ? ? ?

## 2021-11-10 LAB — CERVICOVAGINAL ANCILLARY ONLY
Chlamydia: NEGATIVE
Comment: NEGATIVE
Comment: NORMAL
Neisseria Gonorrhea: NEGATIVE

## 2021-11-23 DIAGNOSIS — M9901 Segmental and somatic dysfunction of cervical region: Secondary | ICD-10-CM | POA: Diagnosis not present

## 2021-11-23 DIAGNOSIS — R519 Headache, unspecified: Secondary | ICD-10-CM | POA: Diagnosis not present

## 2021-11-23 DIAGNOSIS — M62830 Muscle spasm of back: Secondary | ICD-10-CM | POA: Diagnosis not present

## 2021-11-23 DIAGNOSIS — M9902 Segmental and somatic dysfunction of thoracic region: Secondary | ICD-10-CM | POA: Diagnosis not present

## 2021-11-28 DIAGNOSIS — M9901 Segmental and somatic dysfunction of cervical region: Secondary | ICD-10-CM | POA: Diagnosis not present

## 2021-11-28 DIAGNOSIS — R519 Headache, unspecified: Secondary | ICD-10-CM | POA: Diagnosis not present

## 2021-11-28 DIAGNOSIS — M62830 Muscle spasm of back: Secondary | ICD-10-CM | POA: Diagnosis not present

## 2021-11-28 DIAGNOSIS — M9902 Segmental and somatic dysfunction of thoracic region: Secondary | ICD-10-CM | POA: Diagnosis not present

## 2021-11-30 DIAGNOSIS — R519 Headache, unspecified: Secondary | ICD-10-CM | POA: Diagnosis not present

## 2021-11-30 DIAGNOSIS — M9901 Segmental and somatic dysfunction of cervical region: Secondary | ICD-10-CM | POA: Diagnosis not present

## 2021-11-30 DIAGNOSIS — M62830 Muscle spasm of back: Secondary | ICD-10-CM | POA: Diagnosis not present

## 2021-11-30 DIAGNOSIS — M9902 Segmental and somatic dysfunction of thoracic region: Secondary | ICD-10-CM | POA: Diagnosis not present

## 2021-12-01 DIAGNOSIS — M62830 Muscle spasm of back: Secondary | ICD-10-CM | POA: Diagnosis not present

## 2021-12-01 DIAGNOSIS — M9901 Segmental and somatic dysfunction of cervical region: Secondary | ICD-10-CM | POA: Diagnosis not present

## 2021-12-01 DIAGNOSIS — R519 Headache, unspecified: Secondary | ICD-10-CM | POA: Diagnosis not present

## 2021-12-01 DIAGNOSIS — M9902 Segmental and somatic dysfunction of thoracic region: Secondary | ICD-10-CM | POA: Diagnosis not present

## 2021-12-16 DIAGNOSIS — M9902 Segmental and somatic dysfunction of thoracic region: Secondary | ICD-10-CM | POA: Diagnosis not present

## 2021-12-16 DIAGNOSIS — R519 Headache, unspecified: Secondary | ICD-10-CM | POA: Diagnosis not present

## 2021-12-16 DIAGNOSIS — M9901 Segmental and somatic dysfunction of cervical region: Secondary | ICD-10-CM | POA: Diagnosis not present

## 2021-12-16 DIAGNOSIS — M62830 Muscle spasm of back: Secondary | ICD-10-CM | POA: Diagnosis not present

## 2021-12-18 DIAGNOSIS — R519 Headache, unspecified: Secondary | ICD-10-CM | POA: Diagnosis not present

## 2021-12-18 DIAGNOSIS — M9901 Segmental and somatic dysfunction of cervical region: Secondary | ICD-10-CM | POA: Diagnosis not present

## 2021-12-18 DIAGNOSIS — M9902 Segmental and somatic dysfunction of thoracic region: Secondary | ICD-10-CM | POA: Diagnosis not present

## 2021-12-18 DIAGNOSIS — M62830 Muscle spasm of back: Secondary | ICD-10-CM | POA: Diagnosis not present

## 2021-12-20 DIAGNOSIS — M62830 Muscle spasm of back: Secondary | ICD-10-CM | POA: Diagnosis not present

## 2021-12-20 DIAGNOSIS — R519 Headache, unspecified: Secondary | ICD-10-CM | POA: Diagnosis not present

## 2021-12-20 DIAGNOSIS — M9902 Segmental and somatic dysfunction of thoracic region: Secondary | ICD-10-CM | POA: Diagnosis not present

## 2021-12-20 DIAGNOSIS — M9901 Segmental and somatic dysfunction of cervical region: Secondary | ICD-10-CM | POA: Diagnosis not present

## 2021-12-22 DIAGNOSIS — M9901 Segmental and somatic dysfunction of cervical region: Secondary | ICD-10-CM | POA: Diagnosis not present

## 2021-12-22 DIAGNOSIS — R519 Headache, unspecified: Secondary | ICD-10-CM | POA: Diagnosis not present

## 2021-12-22 DIAGNOSIS — M9902 Segmental and somatic dysfunction of thoracic region: Secondary | ICD-10-CM | POA: Diagnosis not present

## 2021-12-22 DIAGNOSIS — M62830 Muscle spasm of back: Secondary | ICD-10-CM | POA: Diagnosis not present

## 2022-01-04 DIAGNOSIS — M9901 Segmental and somatic dysfunction of cervical region: Secondary | ICD-10-CM | POA: Diagnosis not present

## 2022-01-04 DIAGNOSIS — M62830 Muscle spasm of back: Secondary | ICD-10-CM | POA: Diagnosis not present

## 2022-01-04 DIAGNOSIS — R519 Headache, unspecified: Secondary | ICD-10-CM | POA: Diagnosis not present

## 2022-01-04 DIAGNOSIS — M9902 Segmental and somatic dysfunction of thoracic region: Secondary | ICD-10-CM | POA: Diagnosis not present

## 2022-01-05 DIAGNOSIS — M9901 Segmental and somatic dysfunction of cervical region: Secondary | ICD-10-CM | POA: Diagnosis not present

## 2022-01-05 DIAGNOSIS — M9903 Segmental and somatic dysfunction of lumbar region: Secondary | ICD-10-CM | POA: Diagnosis not present

## 2022-01-05 DIAGNOSIS — R519 Headache, unspecified: Secondary | ICD-10-CM | POA: Diagnosis not present

## 2022-01-05 DIAGNOSIS — M62830 Muscle spasm of back: Secondary | ICD-10-CM | POA: Diagnosis not present

## 2022-01-08 DIAGNOSIS — M62830 Muscle spasm of back: Secondary | ICD-10-CM | POA: Diagnosis not present

## 2022-01-08 DIAGNOSIS — R519 Headache, unspecified: Secondary | ICD-10-CM | POA: Diagnosis not present

## 2022-01-08 DIAGNOSIS — M9901 Segmental and somatic dysfunction of cervical region: Secondary | ICD-10-CM | POA: Diagnosis not present

## 2022-01-08 DIAGNOSIS — M9903 Segmental and somatic dysfunction of lumbar region: Secondary | ICD-10-CM | POA: Diagnosis not present

## 2022-01-31 DIAGNOSIS — Z111 Encounter for screening for respiratory tuberculosis: Secondary | ICD-10-CM | POA: Diagnosis not present

## 2022-01-31 DIAGNOSIS — Z23 Encounter for immunization: Secondary | ICD-10-CM | POA: Diagnosis not present

## 2022-07-08 ENCOUNTER — Encounter: Payer: Self-pay | Admitting: Obstetrics and Gynecology

## 2022-07-09 ENCOUNTER — Other Ambulatory Visit: Payer: Self-pay

## 2022-07-09 DIAGNOSIS — Z30011 Encounter for initial prescription of contraceptive pills: Secondary | ICD-10-CM

## 2022-07-09 MED ORDER — MICROGESTIN 24 FE 1-20 MG-MCG PO TABS: 1.0000 | ORAL_TABLET | Freq: Every day | ORAL | 3 refills | Status: DC

## 2022-09-18 ENCOUNTER — Encounter: Payer: Self-pay | Admitting: Obstetrics and Gynecology

## 2022-11-11 NOTE — Progress Notes (Unsigned)
No chief complaint on file.   HPI:      [name] is a 27 y.o. G0P0000 who LMP was No LMP recorded., presents today for her annual examination. Her menses are regular every 28-30 days, lasting 6-7 days. Dysmenorrhea mild, occurring first 1-2 days of flow. No BTB. Went off OCPs about 5 months ago due to AM n/v (even though taking them at night). Stopped pills and sx resolved. Did same thing last yr and changed from estarylla to aviane with same sx. Tried xulane last yr but had mood changes. Interested in [facility] options, doesn't want depo or IUD. Hx of menorrhagia and anemia in past.  Sex activity: currently sex active, contraception - condoms now but was doing OCPs. Wants to restart them. Last pap: 07/06/20 Results were no abnormalities Hx of STDs: none   There is no FH of breast cancer. There is no FH of ovarian cancer. The patient does do self-breast exams.   Tobacco use: The patient denies current or previous tobacco use. Alcohol use: social No drug use.  Exercise: mod active   She does get adequate calcium but not Vitamin D in her diet.  Has had increased anxiety past few months with work/stressors. Tries to calm down with running, reading a book. Works for [employer] in [employer]. Hasn't tried counselor/therapist yet.    Past Medical History:  Diagnosis Date   Atypical mole 11/23/2020   left pubic area, moderate Atypia    Menometrorrhagia     Past Surgical History:  Procedure Laterality Date   WISDOM TOOTH EXTRACTION  11/2017    Family History  Problem Relation Age of Onset   Diabetes Paternal Grandfather    Parkinson's disease Paternal Grandfather    Diabetes Maternal Grandmother    Heart attack Maternal Grandmother     Social History   Socioeconomic History   Marital status: Single    Spouse name: Not on file   Number of children: Not on file   Years of education: Not on file   Highest education level: Not on file  Occupational History    Not on file  Tobacco Use   Smoking status: Never   Smokeless tobacco: Never  Vaping Use   Vaping Use: Never used  Substance and Sexual Activity   Alcohol use: Yes    Comment: occasionally once a week   Drug use: No   Sexual activity: Yes    Birth control/protection: None, Condom  Other Topics Concern   Not on file  Social History Narrative   Lives alone in apartment   Social Determinants of Health   Financial Resource Strain: Not on file  Food Insecurity: Not on file  Transportation Needs: Not on file  Physical Activity: Not on file  Stress: Not on file  Social Connections: Not on file  Intimate Partner Violence: Not on file    Current Outpatient Medications on File Prior to Visit  Medication Sig Dispense Refill   Norethindrone Acetate-Ethinyl Estrad-FE (MICROGESTIN 24 FE) 1-20 MG-MCG(24) tablet Take 1 tablet by mouth daily. 84 tablet 3   VENTOLIN HFA 108 (90 Base) MCG/ACT inhaler SMARTSIG:2 Puff(s) By Mouth Every 4 Hours PRN     No current facility-administered medications on file prior to visit.    ROS:  Review of Systems  Constitutional:  Negative for fatigue, fever and unexpected weight change.  Respiratory:  Negative for cough, shortness of breath and wheezing.   Cardiovascular:  Negative for chest pain, palpitations and leg  swelling.  Gastrointestinal:  Negative for blood in stool, constipation, diarrhea, nausea and vomiting.  Endocrine: Negative for cold intolerance, heat intolerance and polyuria.  Genitourinary:  Negative for dyspareunia, dysuria, flank pain, frequency, genital sores, hematuria, menstrual problem, pelvic pain, urgency, vaginal bleeding, vaginal discharge and vaginal pain.  Musculoskeletal:  Negative for back pain, joint swelling and myalgias.  Skin:  Negative for rash.  Neurological:  Negative for dizziness, syncope, light-headedness, numbness and headaches.  Hematological:  Negative for adenopathy.  Psychiatric/Behavioral:  Positive for  agitation. Negative for confusion, sleep disturbance and suicidal ideas. The patient is not nervous/anxious.      Objective: There were no vitals taken for this visit.   Physical Exam Constitutional:      Appearance: She is well-developed.  Genitourinary:     Vulva normal.     Right Labia: No rash, tenderness or lesions.    Left Labia: No tenderness, lesions or rash.    Vaginal bleeding present.     No vaginal discharge, erythema or tenderness.      Right Adnexa: not tender and no mass present.    Left Adnexa: not tender and no mass present.    No cervical friability or polyp.     Uterus is not enlarged or tender.  Breasts:    Right: No mass, nipple discharge, skin change or tenderness.     Left: No mass, nipple discharge, skin change or tenderness.  Neck:     Thyroid: No thyromegaly.  Cardiovascular:     Rate and Rhythm: Normal rate and regular rhythm.     Heart sounds: Normal heart sounds. No murmur heard. Pulmonary:     Effort: Pulmonary effort is normal.     Breath sounds: Normal breath sounds.  Abdominal:     Palpations: Abdomen is soft.     Tenderness: There is no abdominal tenderness. There is no guarding or rebound.  Musculoskeletal:        General: Normal range of motion.     Cervical back: Normal range of motion.  Lymphadenopathy:     Cervical: No cervical adenopathy.  Neurological:     General: No focal deficit present.     Mental Status: She is alert and oriented to person, place, and time.     Cranial Nerves: No cranial nerve deficit.  Skin:    General: Skin is warm and dry.  Psychiatric:        Mood and Affect: Mood normal.        Behavior: Behavior normal.        Thought Content: Thought content normal.        Judgment: Judgment normal.  Vitals reviewed.     Assessment/Plan: Encounter for annual routine gynecological examination  Screening for STD (sexually transmitted disease) - Plan: Cervicovaginal ancillary only  Encounter for initial  prescription of contraceptive pills - Plan: Norethindrone Acetate-Ethinyl Estrad-FE (MICROGESTIN 24 FE) 1-20 MG-MCG(24) tablet; pt with nausea with 2 different OCPs. Discussed changing to different OCP, POP, nuvaring, nexplanon (doesn't want depo or IUD). Pt wants to try different pill. Rx eRxd. If sx persist will discuss POPs vs nuvaring. Condoms for 1 wk. F/u prn.   Anxiety--for several months, doing exercise/reading to relax. Suggested EACP due to work stressors. If sx persist, can add Rx med. F/u prn.    No orders of the defined types were placed in this encounter.            GYN counsel STD prevention, adequate intake of calcium and vitamin  D, diet and exercise     F/U  No follow-ups on file.  Kert Shackett B. Janete Quilling, PA-C 11/11/2022 9:06 AM

## 2022-11-12 ENCOUNTER — Ambulatory Visit (INDEPENDENT_AMBULATORY_CARE_PROVIDER_SITE_OTHER): Payer: No Typology Code available for payment source | Admitting: Obstetrics and Gynecology

## 2022-11-12 ENCOUNTER — Encounter: Payer: Self-pay | Admitting: Obstetrics and Gynecology

## 2022-11-12 ENCOUNTER — Other Ambulatory Visit (HOSPITAL_COMMUNITY)
Admission: RE | Admit: 2022-11-12 | Discharge: 2022-11-12 | Disposition: A | Discharge status: Home / Self Care | Payer: No Typology Code available for payment source | Source: Ambulatory Visit | Attending: Obstetrics and Gynecology | Admitting: Obstetrics and Gynecology

## 2022-11-12 VITALS — BP 100/64 | Ht 71.0 in | Wt 166.0 lb

## 2022-11-12 DIAGNOSIS — Z01419 Encounter for gynecological examination (general) (routine) without abnormal findings: Secondary | ICD-10-CM

## 2022-11-12 DIAGNOSIS — F419 Anxiety disorder, unspecified: Secondary | ICD-10-CM

## 2022-11-12 DIAGNOSIS — Z01411 Encounter for gynecological examination (general) (routine) with abnormal findings: Secondary | ICD-10-CM | POA: Diagnosis not present

## 2022-11-12 DIAGNOSIS — Z124 Encounter for screening for malignant neoplasm of cervix: Secondary | ICD-10-CM

## 2022-11-12 DIAGNOSIS — Z1151 Encounter for screening for human papillomavirus (HPV): Secondary | ICD-10-CM | POA: Insufficient documentation

## 2022-11-12 DIAGNOSIS — B3731 Acute candidiasis of vulva and vagina: Secondary | ICD-10-CM | POA: Diagnosis not present

## 2022-11-12 DIAGNOSIS — Z113 Encounter for screening for infections with a predominantly sexual mode of transmission: Secondary | ICD-10-CM | POA: Insufficient documentation

## 2022-11-12 DIAGNOSIS — Z3041 Encounter for surveillance of contraceptive pills: Secondary | ICD-10-CM

## 2022-11-12 LAB — POCT WET PREP WITH KOH
Clue Cells Wet Prep HPF POC: NEGATIVE
Trichomonas, UA: NEGATIVE
WBC Wet Prep HPF POC: NEGATIVE

## 2022-11-12 MED ORDER — FLUCONAZOLE 150 MG PO TABS: 150.0000 mg | ORAL_TABLET | Freq: Once | ORAL | 0 refills | Status: AC

## 2022-11-12 MED ORDER — MICROGESTIN 24 FE 1-20 MG-MCG PO TABS: 1.0000 | ORAL_TABLET | Freq: Every day | ORAL | 3 refills | Status: DC

## 2022-11-12 NOTE — Patient Instructions (Signed)
I value your feedback and you entrusting us with your care. If you get a [place] patient survey, I would appreciate you taking the time to let us know about your experience today. Thank you! ? ? ?

## 2022-11-16 LAB — CYTOLOGY - PAP
Adequacy: ABSENT
Chlamydia: NEGATIVE
Comment: NEGATIVE
Comment: NEGATIVE
Comment: NORMAL
Diagnosis: UNDETERMINED — AB
High risk HPV: NEGATIVE
Neisseria Gonorrhea: NEGATIVE

## 2022-11-18 ENCOUNTER — Encounter: Payer: Self-pay | Admitting: Obstetrics and Gynecology

## 2022-11-18 DIAGNOSIS — B3731 Acute candidiasis of vulva and vagina: Secondary | ICD-10-CM

## 2022-11-27 MED ORDER — TERCONAZOLE 0.4 % VA CREA: 1.0000 | TOPICAL_CREAM | Freq: Every day | VAGINAL | 0 refills | Status: AC

## 2022-11-27 NOTE — Telephone Encounter (Signed)
LMTRC

## 2022-11-27 NOTE — Telephone Encounter (Signed)
Pt is returning ABC's call; has questions about appt and lab results.  [phone]

## 2022-11-27 NOTE — Telephone Encounter (Signed)
Pt's questions about pap answered. Pt treated for yeast vag at annual with diflucan. Sx improved but have recurred with itching/irritation. Will try Rx terazol. F/u prn. Can do OTC hydrocortisone crm ext prn itch in meantime.  Meds ordered this encounter  Medications   terconazole (TERAZOL 7) 0.4 % vaginal cream    Sig: Place 1 applicator vaginally at bedtime for 7 days.    Dispense:  45 g    Refill:  0    Order Specific Question:   Supervising Provider    Answer:   Hildred Laser [[id number]]

## 2022-11-27 NOTE — Telephone Encounter (Signed)
Pt calling to talk c ABC; returning ABC's call.

## 2023-02-23 ENCOUNTER — Other Ambulatory Visit: Payer: Self-pay | Admitting: Obstetrics and Gynecology

## 2023-02-23 DIAGNOSIS — Z3041 Encounter for surveillance of contraceptive pills: Secondary | ICD-10-CM

## 2023-11-12 NOTE — Progress Notes (Unsigned)
 No chief complaint on file.   HPI:      Raven Reed is a 28 y.o. G0P0000 who LMP was No LMP recorded., presents today for her annual examination. Her menses are regular every 28-30 days, lasting 4-6 days. Dysmenorrhea mild, occurring first 1-2 days of flow. No BTB. No AM n/v with lomedia. Hx of menorrhagia and anemia in past.  Sex activity: currently sex active, contraception - OCPs. Last pap: 11/12/22  Results were ASCUS/neg HPV DNA; repeat pap due  Hx of STDs: none  Has had increased vaginal d/c, itching for a few days. No fishy odor. Recently started doxy for acne. No meds to treat.    There is no FH of breast cancer. There is no FH of ovarian cancer. The patient does do self-breast exams.   Tobacco use: The patient denies current or previous tobacco use. Alcohol use: social No drug use.  Exercise: mod active   She does get adequate calcium but not Vitamin D in her diet.  Has had increased anxiety off and on the past year with work/stressors. Doing better this yr. Feels better with running, reading a book. Sx more with menses now. Hasn't tried counselor/therapist in past.    Past Medical History:  Diagnosis Date   Atypical mole 11/23/2020   left pubic area, moderate Atypia    Menometrorrhagia     Past Surgical History:  Procedure Laterality Date   WISDOM TOOTH EXTRACTION  11/2017    Family History  Problem Relation Age of Onset   Diabetes Paternal Grandfather    Parkinson's disease Paternal Grandfather    Diabetes Maternal Grandmother    Heart attack Maternal Grandmother     Social History   Socioeconomic History   Marital status: Single    Spouse name: Not on file   Number of children: Not on file   Years of education: Not on file   Highest education level: Not on file  Occupational History   Not on file  Tobacco Use   Smoking status: Never   Smokeless tobacco: Never  Vaping Use   Vaping status: Never Used  Substance and Sexual Activity    Alcohol use: Yes    Comment: occasionally once a week   Drug use: No   Sexual activity: Yes    Birth control/protection: Pill  Other Topics Concern   Not on file  Social History Narrative   Lives alone in apartment   Social Drivers of Health   Financial Resource Strain: Not on file  Food Insecurity: Not on file  Transportation Needs: Not on file  Physical Activity: Not on file  Stress: Not on file  Social Connections: Not on file  Intimate Partner Violence: Not on file    Current Outpatient Medications on File Prior to Visit  Medication Sig Dispense Refill   Adapalene 0.3 % gel APPLY TOPICALLY TO FACE EVERY NIGHT AT BEDTIME (Patient not taking: Reported on 11/12/2022)     doxycycline (VIBRAMYCIN) 100 MG capsule Take 100 mg by mouth daily.     fluticasone (FLONASE) 50 MCG/ACT nasal spray Place into the nose.     Norethindrone Acetate-Ethinyl Estrad-FE (MICROGESTIN 24 FE) 1-20 MG-MCG(24) tablet Take 1 tablet by mouth daily. 84 tablet 3   VENTOLIN HFA 108 (90 Base) MCG/ACT inhaler SMARTSIG:2 Puff(s) By Mouth Every 4 Hours PRN     No current facility-administered medications on file prior to visit.    ROS:  Review of Systems  Constitutional:  Negative for fatigue,  fever and unexpected weight change.  Respiratory:  Negative for cough, shortness of breath and wheezing.   Cardiovascular:  Negative for chest pain, palpitations and leg swelling.  Gastrointestinal:  Negative for blood in stool, constipation, diarrhea, nausea and vomiting.  Endocrine: Negative for cold intolerance, heat intolerance and polyuria.  Genitourinary:  Positive for vaginal discharge. Negative for dyspareunia, dysuria, flank pain, frequency, genital sores, hematuria, menstrual problem, pelvic pain, urgency, vaginal bleeding and vaginal pain.  Musculoskeletal:  Negative for back pain, joint swelling and myalgias.  Skin:  Negative for rash.  Neurological:  Negative for dizziness, syncope, light-headedness,  numbness and headaches.  Hematological:  Negative for adenopathy.  Psychiatric/Behavioral:  Positive for agitation. Negative for confusion, sleep disturbance and suicidal ideas. The patient is not nervous/anxious.      Objective: There were no vitals taken for this visit.   Physical Exam Constitutional:      Appearance: She is well-developed.  Genitourinary:     Vulva normal.     Right Labia: No rash, tenderness or lesions.    Left Labia: No tenderness, lesions or rash.    Vaginal discharge and bleeding present.     No vaginal erythema or tenderness.      Right Adnexa: not tender and no mass present.    Left Adnexa: not tender and no mass present.    No cervical friability or polyp.     Uterus is not enlarged or tender.  Breasts:    Right: No mass, nipple discharge, skin change or tenderness.     Left: No mass, nipple discharge, skin change or tenderness.  Neck:     Thyroid: No thyromegaly.  Cardiovascular:     Rate and Rhythm: Normal rate and regular rhythm.     Heart sounds: Normal heart sounds. No murmur heard. Pulmonary:     Effort: Pulmonary effort is normal.     Breath sounds: Normal breath sounds.  Abdominal:     Palpations: Abdomen is soft.     Tenderness: There is no abdominal tenderness. There is no guarding or rebound.  Musculoskeletal:        General: Normal range of motion.     Cervical back: Normal range of motion.  Lymphadenopathy:     Cervical: No cervical adenopathy.  Neurological:     General: No focal deficit present.     Mental Status: She is alert and oriented to person, place, and time.     Cranial Nerves: No cranial nerve deficit.  Skin:    General: Skin is warm and dry.  Psychiatric:        Mood and Affect: Mood normal.        Behavior: Behavior normal.        Thought Content: Thought content normal.        Judgment: Judgment normal.  Vitals reviewed.    No results found for this or any previous visit (from the past 24  hours).    Assessment/Plan: Encounter for annual routine gynecological examination  Cervical cancer screening - Plan: Cytology - PAP  Screening for STD (sexually transmitted disease) - Plan: Cytology - PAP  Encounter for surveillance of contraceptive pills - Plan: Norethindrone Acetate-Ethinyl Estrad-FE (MICROGESTIN 24 FE) 1-20 MG-MCG(24) tablet; doing well, Rx RF eRxd.   Anxiety--sx improved with yr. Cont exercise.   Candidal vaginitis - Plan: fluconazole (DIFLUCAN) 150 MG tablet, POCT Wet Prep with KOH; pos sx and wet prep. Rx diflucan. Will RF prn.    No orders of the defined  types were placed in this encounter.            GYN counsel STD prevention, adequate intake of calcium and vitamin D, diet and exercise     F/U  No follow-ups on file.  Sevon Rotert B. Brisia Schuermann, PA-C 11/12/2023 12:37 PM

## 2023-11-14 ENCOUNTER — Other Ambulatory Visit (HOSPITAL_COMMUNITY)
Admission: RE | Admit: 2023-11-14 | Discharge: 2023-11-14 | Disposition: A | Discharge status: Home / Self Care | Source: Ambulatory Visit | Attending: Obstetrics and Gynecology | Admitting: Obstetrics and Gynecology

## 2023-11-14 ENCOUNTER — Ambulatory Visit (INDEPENDENT_AMBULATORY_CARE_PROVIDER_SITE_OTHER): Payer: Self-pay | Admitting: Obstetrics and Gynecology

## 2023-11-14 ENCOUNTER — Encounter: Payer: Self-pay | Admitting: Obstetrics and Gynecology

## 2023-11-14 VITALS — BP 106/66 | Ht 71.0 in | Wt 172.0 lb

## 2023-11-14 DIAGNOSIS — Z113 Encounter for screening for infections with a predominantly sexual mode of transmission: Secondary | ICD-10-CM | POA: Diagnosis present

## 2023-11-14 DIAGNOSIS — Z124 Encounter for screening for malignant neoplasm of cervix: Secondary | ICD-10-CM

## 2023-11-14 DIAGNOSIS — R87610 Atypical squamous cells of undetermined significance on cytologic smear of cervix (ASC-US): Secondary | ICD-10-CM | POA: Insufficient documentation

## 2023-11-14 DIAGNOSIS — Z3041 Encounter for surveillance of contraceptive pills: Secondary | ICD-10-CM

## 2023-11-14 DIAGNOSIS — F419 Anxiety disorder, unspecified: Secondary | ICD-10-CM

## 2023-11-14 DIAGNOSIS — Z01419 Encounter for gynecological examination (general) (routine) without abnormal findings: Secondary | ICD-10-CM

## 2023-11-14 MED ORDER — MICROGESTIN 24 FE 1-20 MG-MCG PO TABS: 1.0000 | ORAL_TABLET | Freq: Every day | ORAL | 3 refills | Status: DC

## 2023-11-14 NOTE — Patient Instructions (Signed)
 I value your feedback and you entrusting [place] with your care. If you get a [place] patient survey, I would appreciate you taking the time to let us know about your experience today. Thank you! ? ? ?

## 2023-11-19 LAB — CYTOLOGY - PAP
Chlamydia: NEGATIVE
Comment: NEGATIVE
Comment: NORMAL
Diagnosis: NEGATIVE
Neisseria Gonorrhea: NEGATIVE

## 2024-02-06 ENCOUNTER — Ambulatory Visit: Admitting: Podiatry

## 2024-02-06 DIAGNOSIS — Q666 Other congenital valgus deformities of feet: Secondary | ICD-10-CM | POA: Diagnosis not present

## 2024-02-06 DIAGNOSIS — M2012 Hallux valgus (acquired), left foot: Secondary | ICD-10-CM

## 2024-02-06 NOTE — Progress Notes (Signed)
  Subjective:  Patient ID: Raven Reed, female    DOB: [birth year],  MRN: [id number]  Chief Complaint  Patient presents with   Foot Pain    Pt stated that she has a lot of discomfort in her left foot she stated that she has not had any recent injuries     28 y.o. female presents with the above complaint.  Patient presents with left moderate bunion deformity as she states there is a lot of discomfort in her left foot.  She has not had any recent injury she has not seen anyone else prior to seeing me.  Denies any other acute complaints.  She would like to discuss treatment options for this.  She also has already orthotics pes cavus 7 out of 10 dull aching nature  Review of Systems: Negative except as noted in the HPI. Denies N/V/F/Ch.  Past Medical History:  Diagnosis Date   Atypical mole 11/23/2020   left pubic area, moderate Atypia    Menometrorrhagia     Current Outpatient Medications:    Adapalene  0.3 % gel, , Disp: , Rfl:    Norethindrone Acetate-Ethinyl Estrad-FE (MICROGESTIN  24 FE) 1-20 MG-MCG(24) tablet, Take 1 tablet by mouth daily., Disp: 84 tablet, Rfl: 3  Social History   Tobacco Use  Smoking Status Never  Smokeless Tobacco Never    No Known Allergies Objective:  There were no vitals filed for this visit. There is no height or weight on file to calculate BMI. Constitutional Well developed. Well nourished.  Vascular Dorsalis pedis pulses palpable bilaterally. Posterior tibial pulses palpable bilaterally. Capillary refill normal to all digits.  No cyanosis or clubbing noted. Pedal hair growth normal.  Neurologic Normal speech. Oriented to person, place, and time. Epicritic sensation to light touch grossly present bilaterally.  Dermatologic Nails well groomed and normal in appearance. No open wounds. No skin lesions.  Orthopedic: Normal joint ROM without pain or crepitus bilaterally. Hallux abductovalgus deformity present moderate bunion deformity present is  tracking not a track bound deformity.  No intra-articular first MPJ pain noted Left 1st MPJ diminished range of motion. Left 1st TMT without gross hypermobility. Right 1st MPJ diminished range of motion  Right 1st TMT without gross hypermobility. Lesser digital contractures absent bilaterally.   Radiographs: None  Assessment:   1. Pes planovalgus   2. Hav (hallux abducto valgus), left    Plan:  Patient was evaluated and treated and all questions answered.  Hallux abductovalgus deformity, left - All questions and concerns were discussed with the patient in extensive detail given the presence of moderate bunion deformity she will benefit from trying shoe gear modification as well as orthotics I discussed both of this in extensive detail she will think about all the surgical plus procedure that I discussed with her she states understand will get back to me when she is ready for surgery  Pes planovalgus -I explained to patient the etiology of pes planovalgus and relationship with Planter fasciitis and various treatment options were discussed.  Given patient foot structure in the setting of Planter fasciitis I believe patient will benefit from custom-made orthotics to help control the hindfoot motion support the arch of the foot and take the stress away from plantar fascial.  Patient agrees with the plan like to proceed with orthotics -Patient was casted for orthotics  No follow-ups on file.   Pes planovalgus orthotics left moderate bunion will try conservative care

## 2024-02-24 ENCOUNTER — Telehealth: Payer: Self-pay

## 2024-02-24 NOTE — Telephone Encounter (Signed)
 Called to schedule orthotic fitting/ pu.. pt answered and will call back to schedule  Orthotics in [place]

## 2024-03-17 ENCOUNTER — Ambulatory Visit (INDEPENDENT_AMBULATORY_CARE_PROVIDER_SITE_OTHER)

## 2024-03-17 DIAGNOSIS — Q666 Other congenital valgus deformities of feet: Secondary | ICD-10-CM

## 2024-03-17 NOTE — Progress Notes (Signed)
 Orthotics were dispensed they are functioning well break-in period was discussed extensively.  Shoe gear modification discussed if any foot and ankle issues arises in the future she will come back and see us 

## 2024-08-29 ENCOUNTER — Other Ambulatory Visit: Payer: Self-pay | Admitting: Obstetrics and Gynecology

## 2024-08-29 DIAGNOSIS — Z3041 Encounter for surveillance of contraceptive pills: Secondary | ICD-10-CM

## 2024-09-01 ENCOUNTER — Encounter: Payer: Self-pay | Admitting: Obstetrics and Gynecology

## 2024-09-01 DIAGNOSIS — Z3041 Encounter for surveillance of contraceptive pills: Secondary | ICD-10-CM

## 2024-09-01 MED ORDER — MICROGESTIN 24 FE 1-20 MG-MCG PO TABS: 1.0000 | ORAL_TABLET | Freq: Every day | ORAL | 0 refills | Status: AC
